# Patient Record
Sex: Male | Born: 1975 | Race: White | Hispanic: No | Marital: Married | Smoking: Never smoker
Health system: Southern US, Community
[De-identification: ages and names within clinical notes are randomized; demographics above are authoritative.]

## PROBLEM LIST (undated history)

## (undated) DIAGNOSIS — F419 Anxiety disorder, unspecified: Secondary | ICD-10-CM

## (undated) DIAGNOSIS — E109 Type 1 diabetes mellitus without complications: Secondary | ICD-10-CM

## (undated) DIAGNOSIS — G8929 Other chronic pain: Secondary | ICD-10-CM

## (undated) DIAGNOSIS — T7840XA Allergy, unspecified, initial encounter: Secondary | ICD-10-CM

## (undated) DIAGNOSIS — M1990 Unspecified osteoarthritis, unspecified site: Secondary | ICD-10-CM

## (undated) HISTORY — PX: ANKLE SURGERY: SHX546

## (undated) HISTORY — DX: Other chronic pain: G89.29

## (undated) HISTORY — DX: Unspecified osteoarthritis, unspecified site: M19.90

## (undated) HISTORY — PX: JOINT REPLACEMENT: SHX530

## (undated) HISTORY — DX: Allergy, unspecified, initial encounter: T78.40XA

## (undated) HISTORY — PX: CARDIAC SURGERY: SHX584

## (undated) HISTORY — PX: OTHER SURGICAL HISTORY: SHX169

## (undated) HISTORY — DX: Anxiety disorder, unspecified: F41.9

## (undated) HISTORY — DX: Type 1 diabetes mellitus without complications: E10.9

---

## 2001-03-25 ENCOUNTER — Emergency Department (HOSPITAL_COMMUNITY): Admission: EM | Admit: 2001-03-25 | Discharge: 2001-03-25 | Payer: Self-pay | Admitting: Internal Medicine

## 2007-04-04 ENCOUNTER — Emergency Department: Payer: Self-pay | Admitting: Emergency Medicine

## 2009-12-23 ENCOUNTER — Emergency Department: Payer: Self-pay | Admitting: Emergency Medicine

## 2010-06-13 ENCOUNTER — Emergency Department: Payer: Self-pay | Admitting: Internal Medicine

## 2010-09-26 ENCOUNTER — Emergency Department (HOSPITAL_COMMUNITY)
Admission: EM | Admit: 2010-09-26 | Discharge: 2010-09-26 | Payer: Self-pay | Source: Home / Self Care | Admitting: Emergency Medicine

## 2011-09-04 ENCOUNTER — Emergency Department: Payer: Self-pay | Admitting: *Deleted

## 2012-01-05 ENCOUNTER — Emergency Department: Payer: Self-pay | Admitting: Emergency Medicine

## 2012-02-13 ENCOUNTER — Observation Stay: Payer: Self-pay | Admitting: Student

## 2012-02-13 DIAGNOSIS — R079 Chest pain, unspecified: Secondary | ICD-10-CM

## 2012-02-13 LAB — CBC
MCH: 29.3 pg (ref 26.0–34.0)
MCHC: 33.1 g/dL (ref 32.0–36.0)
RBC: 5.48 10*6/uL (ref 4.40–5.90)
WBC: 7.9 10*3/uL (ref 3.8–10.6)

## 2012-02-13 LAB — DRUG SCREEN, URINE
Benzodiazepine, Ur Scrn: NEGATIVE (ref ?–200)
Cannabinoid 50 Ng, Ur ~~LOC~~: POSITIVE (ref ?–50)
Cocaine Metabolite,Ur ~~LOC~~: NEGATIVE (ref ?–300)
MDMA (Ecstasy)Ur Screen: NEGATIVE (ref ?–500)
Methadone, Ur Screen: NEGATIVE (ref ?–300)
Opiate, Ur Screen: POSITIVE (ref ?–300)
Phencyclidine (PCP) Ur S: NEGATIVE (ref ?–25)

## 2012-02-13 LAB — CK TOTAL AND CKMB (NOT AT ARMC): CK, Total: 211 U/L (ref 35–232)

## 2012-02-13 LAB — BASIC METABOLIC PANEL
BUN: 13 mg/dL (ref 7–18)
Co2: 29 mmol/L (ref 21–32)
EGFR (African American): >60
Osmolality: 275 (ref 275–301)
Potassium: 4.3 mmol/L (ref 3.5–5.1)

## 2012-02-13 LAB — TROPONIN I
Troponin-I: <0.02 ng/mL
Troponin-I: <0.02 ng/mL

## 2012-02-14 LAB — LIPID PANEL
Cholesterol: 155 mg/dL (ref 0–200)
HDL Cholesterol: 39 mg/dL — ABNORMAL LOW (ref 40–60)
Triglycerides: 120 mg/dL (ref 0–200)

## 2012-02-14 LAB — BASIC METABOLIC PANEL
BUN: 15 mg/dL (ref 7–18)
Chloride: 104 mmol/L (ref 98–107)
Creatinine: 0.94 mg/dL (ref 0.60–1.30)
EGFR (African American): >60
EGFR (Non-African Amer.): >60
Glucose: 89 mg/dL (ref 65–99)

## 2012-02-14 LAB — CBC WITH DIFFERENTIAL/PLATELET
Basophil #: 0 10*3/uL (ref 0.0–0.1)
Eosinophil %: 6.3 %
HCT: 45.6 % (ref 40.0–52.0)
MCH: 29.4 pg (ref 26.0–34.0)
Monocyte #: 0.6 x10 3/mm (ref 0.2–1.0)
Platelet: 150 10*3/uL (ref 150–440)
RBC: 5.17 10*6/uL (ref 4.40–5.90)
RDW: 13.9 % (ref 11.5–14.5)

## 2012-02-14 LAB — CK TOTAL AND CKMB (NOT AT ARMC)
CK, Total: 160 U/L (ref 35–232)
CK, Total: 400 U/L — ABNORMAL HIGH (ref 35–232)
CK-MB: 1.2 ng/mL (ref 0.5–3.6)
CK-MB: 1.5 ng/mL (ref 0.5–3.6)

## 2013-01-10 ENCOUNTER — Emergency Department: Payer: Self-pay | Admitting: Emergency Medicine

## 2013-03-29 ENCOUNTER — Emergency Department: Payer: Self-pay | Admitting: Emergency Medicine

## 2013-03-29 LAB — BASIC METABOLIC PANEL
Anion Gap: 4 — ABNORMAL LOW (ref 7–16)
Calcium, Total: 9.3 mg/dL (ref 8.5–10.1)
Chloride: 105 mmol/L (ref 98–107)
Creatinine: 0.73 mg/dL (ref 0.60–1.30)
EGFR (Non-African Amer.): >60
Glucose: 83 mg/dL (ref 65–99)
Osmolality: 270 (ref 275–301)

## 2013-04-21 ENCOUNTER — Emergency Department: Payer: Self-pay

## 2013-04-21 LAB — BASIC METABOLIC PANEL
Anion Gap: 5 — ABNORMAL LOW (ref 7–16)
Calcium, Total: 8.7 mg/dL (ref 8.5–10.1)
Chloride: 109 mmol/L — ABNORMAL HIGH (ref 98–107)
Creatinine: 0.88 mg/dL (ref 0.60–1.30)
EGFR (African American): >60
EGFR (Non-African Amer.): >60
Glucose: 92 mg/dL (ref 65–99)
Osmolality: 283 (ref 275–301)
Potassium: 4.3 mmol/L (ref 3.5–5.1)
Sodium: 142 mmol/L (ref 136–145)

## 2013-04-21 LAB — CBC WITH DIFFERENTIAL/PLATELET
Basophil %: 0.5 %
HGB: 15.2 g/dL (ref 13.0–18.0)
MCHC: 33.7 g/dL (ref 32.0–36.0)
Monocyte %: 6.4 %
Neutrophil #: 6.3 10*3/uL (ref 1.4–6.5)
RDW: 13.9 % (ref 11.5–14.5)
WBC: 11 10*3/uL — ABNORMAL HIGH (ref 3.8–10.6)

## 2013-05-20 ENCOUNTER — Emergency Department: Payer: Self-pay | Admitting: Internal Medicine

## 2013-05-20 LAB — COMPREHENSIVE METABOLIC PANEL
Albumin: 4 g/dL (ref 3.4–5.0)
BUN: 21 mg/dL — ABNORMAL HIGH (ref 7–18)
Bilirubin,Total: 0.8 mg/dL (ref 0.2–1.0)
Calcium, Total: 9.2 mg/dL (ref 8.5–10.1)
Creatinine: 0.9 mg/dL (ref 0.60–1.30)
EGFR (African American): >60
Glucose: 90 mg/dL (ref 65–99)
Osmolality: 282 (ref 275–301)
Potassium: 4.2 mmol/L (ref 3.5–5.1)
SGOT(AST): 23 U/L (ref 15–37)
Total Protein: 7.1 g/dL (ref 6.4–8.2)

## 2013-05-20 LAB — CBC
HGB: 16.1 g/dL (ref 13.0–18.0)
MCV: 88 fL (ref 80–100)
Platelet: 174 10*3/uL (ref 150–440)
WBC: 9.3 10*3/uL (ref 3.8–10.6)

## 2013-05-20 LAB — PROTIME-INR: INR: 1

## 2013-09-16 ENCOUNTER — Emergency Department: Payer: Self-pay | Admitting: Emergency Medicine

## 2013-09-16 LAB — CBC WITH DIFFERENTIAL/PLATELET
Eosinophil #: 0.7 10*3/uL (ref 0.0–0.7)
MCH: 30.9 pg (ref 26.0–34.0)
MCHC: 34.2 g/dL (ref 32.0–36.0)
Monocyte #: 0.7 x10 3/mm (ref 0.2–1.0)
Neutrophil #: 5.9 10*3/uL (ref 1.4–6.5)
Neutrophil %: 62 %
RBC: 5.63 10*6/uL (ref 4.40–5.90)

## 2013-09-16 LAB — COMPREHENSIVE METABOLIC PANEL
Albumin: 3.9 g/dL (ref 3.4–5.0)
Anion Gap: 6 — ABNORMAL LOW (ref 7–16)
BUN: 16 mg/dL (ref 7–18)
Bilirubin,Total: 0.8 mg/dL (ref 0.2–1.0)
Calcium, Total: 9 mg/dL (ref 8.5–10.1)
Co2: 25 mmol/L (ref 21–32)
EGFR (African American): >60
EGFR (Non-African Amer.): >60
Glucose: 111 mg/dL — ABNORMAL HIGH (ref 65–99)
Osmolality: 278 (ref 275–301)
Potassium: 4.3 mmol/L (ref 3.5–5.1)
SGOT(AST): 32 U/L (ref 15–37)
SGPT (ALT): 41 U/L (ref 12–78)
Sodium: 138 mmol/L (ref 136–145)
Total Protein: 6.9 g/dL (ref 6.4–8.2)

## 2013-09-16 LAB — CK TOTAL AND CKMB (NOT AT ARMC)
CK, Total: 731 U/L — ABNORMAL HIGH (ref 35–232)
CK-MB: 5.6 ng/mL — ABNORMAL HIGH (ref 0.5–3.6)

## 2013-09-16 LAB — URINALYSIS, COMPLETE
Bacteria: NONE SEEN
Bilirubin,UR: NEGATIVE
Blood: NEGATIVE
Glucose,UR: NEGATIVE mg/dL (ref 0–75)
Ketone: NEGATIVE
Leukocyte Esterase: NEGATIVE
Nitrite: NEGATIVE
Ph: 6 (ref 4.5–8.0)
Protein: NEGATIVE
RBC,UR: <1 /HPF (ref 0–5)
Specific Gravity: 1.014 (ref 1.003–1.030)
Squamous Epithelial: <1
WBC UR: 2 /HPF (ref 0–5)

## 2013-09-16 LAB — PROTIME-INR
INR: 0.9
Prothrombin Time: 12.6 secs (ref 11.5–14.7)

## 2013-10-19 DIAGNOSIS — R109 Unspecified abdominal pain: Secondary | ICD-10-CM | POA: Insufficient documentation

## 2013-10-27 ENCOUNTER — Emergency Department: Payer: Self-pay | Admitting: Emergency Medicine

## 2013-11-03 DIAGNOSIS — G8929 Other chronic pain: Secondary | ICD-10-CM | POA: Insufficient documentation

## 2013-11-19 DIAGNOSIS — K279 Peptic ulcer, site unspecified, unspecified as acute or chronic, without hemorrhage or perforation: Secondary | ICD-10-CM | POA: Insufficient documentation

## 2013-11-24 DIAGNOSIS — F4310 Post-traumatic stress disorder, unspecified: Secondary | ICD-10-CM | POA: Insufficient documentation

## 2014-03-26 ENCOUNTER — Emergency Department: Payer: Self-pay | Admitting: Emergency Medicine

## 2014-05-30 IMAGING — CT CT THORACIC SPINE WITHOUT CONTRAST
2 series · 10 of 14 positions shown, 12 images · non-contrast
Comparison: none

REASON FOR EXAM: pain following trauma; abnormal XR
COMMENTS:

PROCEDURE:     CT  - CT THORACIC SPINE WO  - January 10, 2013 [DATE]
RESULT:     Comparison: Radiographs performed same day.
TECHNIQUE: Multiple axial images were obtained of the thoracic spine,
without intravenous contrast. Coronal and sagittal reformats were performed.

[Series 2: axials · axial · 0.36mm/px · z∈[-900,-675]mm · 5 of 113 slices shown, 7 images]
[im 19/113  soft-tissue]
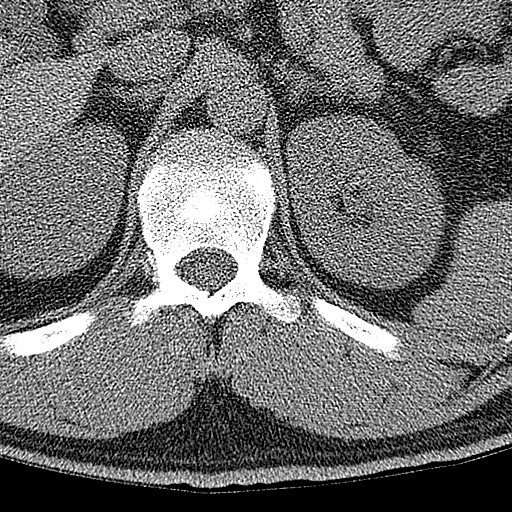
[im 19/113  bone]
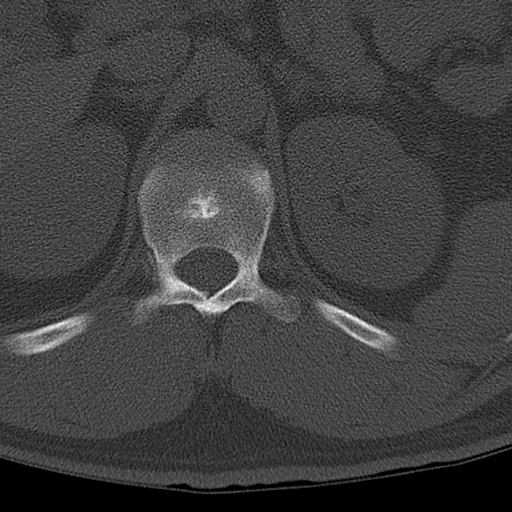
[im 38/113  bone]
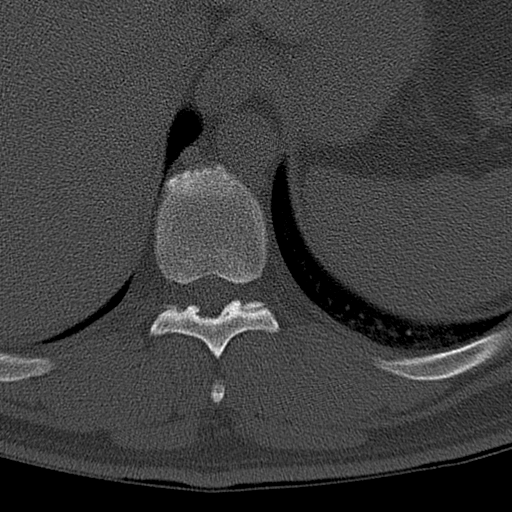
[im 57/113  bone]
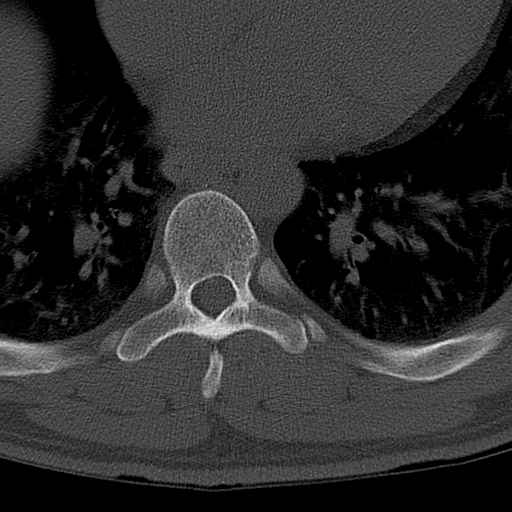
[im 75/113  bone]
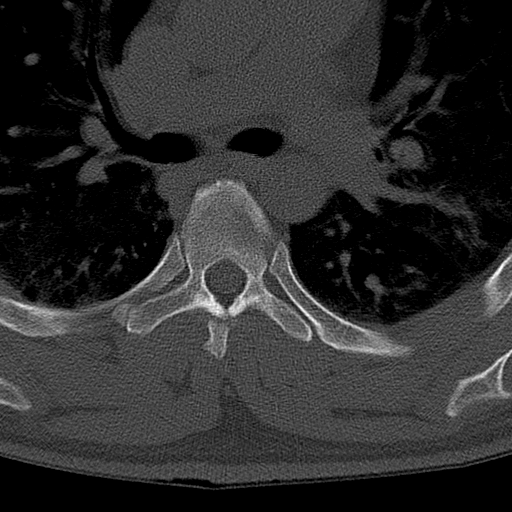
[im 94/113  soft-tissue]
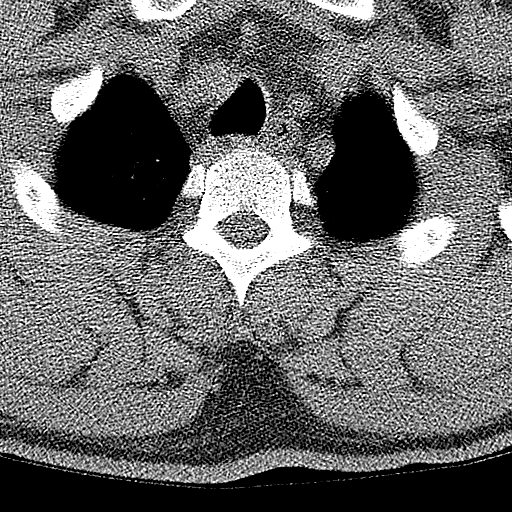
[im 94/113  bone]
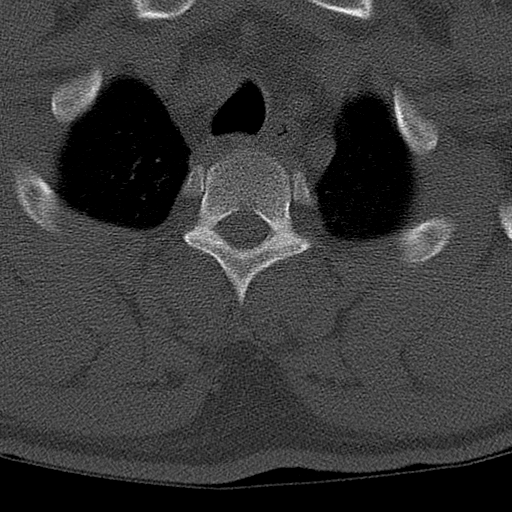

[Series 7: axials soft tissue · axial · 0.36mm/px · z∈[-900,-675]mm · 5 of 113 slices shown]
[im 19/113  soft-tissue]
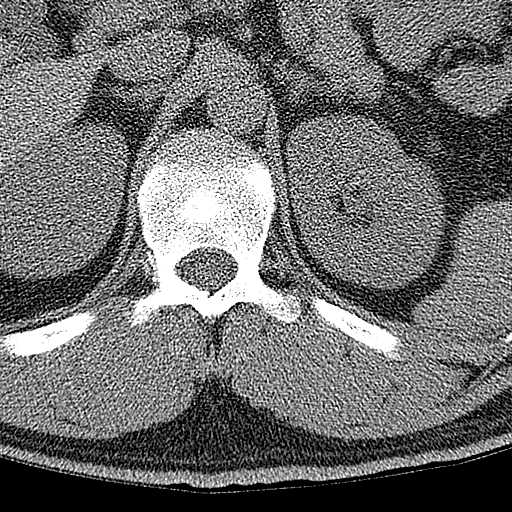
[im 38/113  soft-tissue]
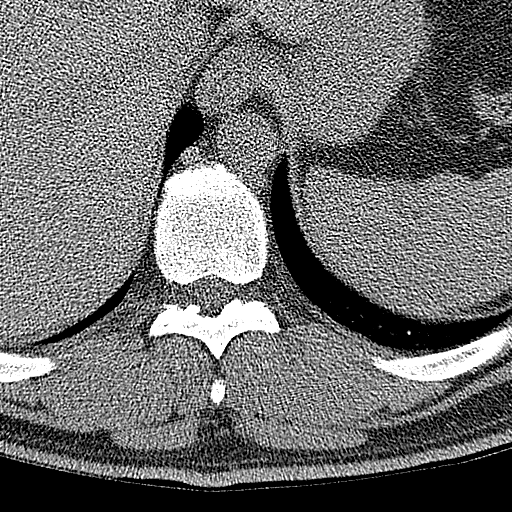
[im 57/113  soft-tissue]
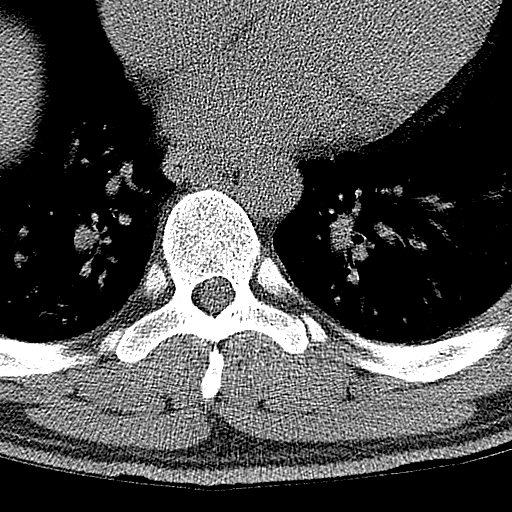
[im 75/113  soft-tissue]
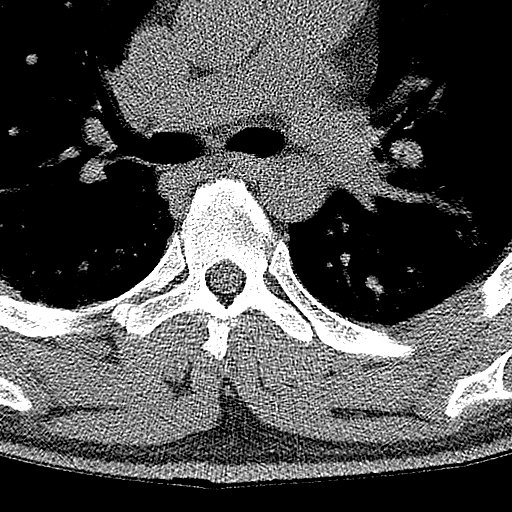
[im 94/113  soft-tissue]
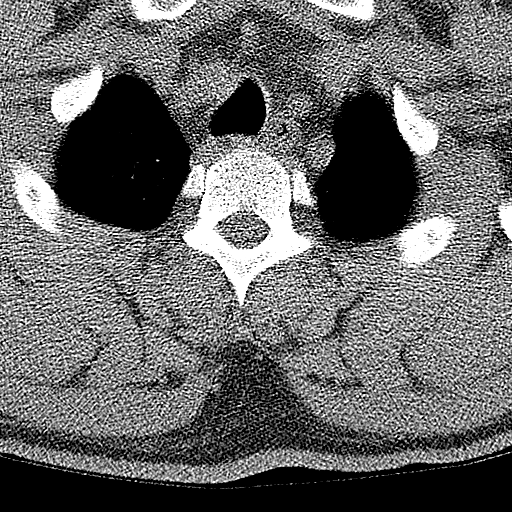

[10 of 14 positions shown; findings below may reference images not displayed]

FINDINGS: There is minimal anterior height loss of the T10, T11, and T12 vertebral
bodies. No definite acute fracture line seen. There are mild endplate
irregularities of these vertebral bodies which are likely due to Schmorl's
nodes. There is normal alignment. There is slight dextrocurvature of the
thoracic spine. Mild dependent pulmonary opacities are likely secondary to
atelectasis.
IMPRESSION: Minimal anterior height loss of the T10, T11, and T12 vertebral bodies is
technically age indeterminate, but likely chronic. Correlate with patient's
history and site of pain. These findings can also be seen as the sequela of
Scheuermann's disease.

[REDACTED]

## 2014-07-05 ENCOUNTER — Ambulatory Visit: Payer: Self-pay

## 2014-08-16 ENCOUNTER — Emergency Department: Payer: Self-pay | Admitting: Student

## 2014-08-16 LAB — URINALYSIS, COMPLETE
BILIRUBIN, UR: NEGATIVE
BLOOD: NEGATIVE
Bacteria: NONE SEEN
Glucose,UR: NEGATIVE mg/dL (ref 0–75)
KETONE: NEGATIVE
Leukocyte Esterase: NEGATIVE
Nitrite: NEGATIVE
PH: 5 (ref 4.5–8.0)
Protein: NEGATIVE
RBC,UR: 1 /HPF (ref 0–5)
Specific Gravity: 1.017 (ref 1.003–1.030)
WBC UR: 2 /HPF (ref 0–5)

## 2014-11-07 LAB — COMPREHENSIVE METABOLIC PANEL
ALT: 50 U/L
ANION GAP: 11 (ref 7–16)
Albumin: 4.3 g/dL (ref 3.4–5.0)
Alkaline Phosphatase: 56 U/L
BILIRUBIN TOTAL: 0.7 mg/dL (ref 0.2–1.0)
BUN: 9 mg/dL (ref 7–18)
CHLORIDE: 106 mmol/L (ref 98–107)
CO2: 25 mmol/L (ref 21–32)
CREATININE: 0.92 mg/dL (ref 0.60–1.30)
Calcium, Total: 9 mg/dL (ref 8.5–10.1)
Glucose: 122 mg/dL — ABNORMAL HIGH (ref 65–99)
OSMOLALITY: 283 (ref 275–301)
POTASSIUM: 3.9 mmol/L (ref 3.5–5.1)
SGOT(AST): 32 U/L (ref 15–37)
SODIUM: 142 mmol/L (ref 136–145)
TOTAL PROTEIN: 7.5 g/dL (ref 6.4–8.2)

## 2014-11-07 LAB — URINALYSIS, COMPLETE
Bacteria: NONE SEEN
Bilirubin,UR: NEGATIVE
Blood: NEGATIVE
Glucose,UR: NEGATIVE mg/dL (ref 0–75)
Ketone: NEGATIVE
Leukocyte Esterase: NEGATIVE
NITRITE: NEGATIVE
PROTEIN: NEGATIVE
Ph: 7 (ref 4.5–8.0)
RBC, UR: NONE SEEN /HPF (ref 0–5)
Specific Gravity: 1.006 (ref 1.003–1.030)
Squamous Epithelial: <1

## 2014-11-07 LAB — CBC WITH DIFFERENTIAL/PLATELET
Basophil #: 0 10*3/uL (ref 0.0–0.1)
Basophil %: 0.4 %
EOS PCT: 7.2 %
Eosinophil #: 0.7 10*3/uL (ref 0.0–0.7)
HCT: 46.1 % (ref 40.0–52.0)
HGB: 15.6 g/dL (ref 13.0–18.0)
LYMPHS ABS: 2.9 10*3/uL (ref 1.0–3.6)
Lymphocyte %: 30.5 %
MCH: 30.7 pg (ref 26.0–34.0)
MCHC: 33.8 g/dL (ref 32.0–36.0)
MCV: 91 fL (ref 80–100)
MONO ABS: 0.5 x10 3/mm (ref 0.2–1.0)
Monocyte %: 5.4 %
Neutrophil #: 5.4 10*3/uL (ref 1.4–6.5)
Neutrophil %: 56.5 %
PLATELETS: 190 10*3/uL (ref 150–440)
RBC: 5.08 10*6/uL (ref 4.40–5.90)
RDW: 13.7 % (ref 11.5–14.5)
WBC: 9.6 10*3/uL (ref 3.8–10.6)

## 2014-11-07 LAB — DRUG SCREEN, URINE
Amphetamines, Ur Screen: NEGATIVE (ref ?–1000)
BARBITURATES, UR SCREEN: NEGATIVE (ref ?–200)
Benzodiazepine, Ur Scrn: NEGATIVE (ref ?–200)
CANNABINOID 50 NG, UR ~~LOC~~: POSITIVE (ref ?–50)
COCAINE METABOLITE, UR ~~LOC~~: NEGATIVE (ref ?–300)
MDMA (Ecstasy)Ur Screen: NEGATIVE (ref ?–500)
Methadone, Ur Screen: NEGATIVE (ref ?–300)
OPIATE, UR SCREEN: POSITIVE (ref ?–300)
Phencyclidine (PCP) Ur S: NEGATIVE (ref ?–25)
TRICYCLIC, UR SCREEN: NEGATIVE (ref ?–1000)

## 2014-11-07 LAB — TROPONIN I

## 2014-11-07 LAB — PROTIME-INR
INR: 1
Prothrombin Time: 12.8 secs (ref 11.5–14.7)

## 2014-11-07 LAB — APTT: ACTIVATED PTT: 27.9 s (ref 23.6–35.9)

## 2014-11-08 ENCOUNTER — Observation Stay: Payer: Self-pay | Admitting: Internal Medicine

## 2014-11-08 LAB — LIPID PANEL
CHOLESTEROL: 146 mg/dL (ref 0–200)
HDL: 35 mg/dL — AB (ref 40–60)
LDL CHOLESTEROL, CALC: 93 mg/dL (ref 0–100)
TRIGLYCERIDES: 92 mg/dL (ref 0–200)
VLDL CHOLESTEROL, CALC: 18 mg/dL (ref 5–40)

## 2014-11-11 LAB — CREATININE, SERUM
Creatinine: 0.92 mg/dL (ref 0.60–1.30)
EGFR (African American): >60
EGFR (Non-African Amer.): >60

## 2014-11-24 ENCOUNTER — Emergency Department: Payer: Self-pay | Admitting: Student

## 2014-11-24 ENCOUNTER — Ambulatory Visit (HOSPITAL_COMMUNITY)
Admission: AD | Admit: 2014-11-24 | Discharge: 2014-11-24 | Disposition: A | Discharge status: Home / Self Care | Payer: Self-pay | Source: Other Acute Inpatient Hospital | Attending: Neurology | Admitting: Neurology

## 2014-11-24 DIAGNOSIS — R569 Unspecified convulsions: Secondary | ICD-10-CM | POA: Insufficient documentation

## 2014-11-24 LAB — COMPREHENSIVE METABOLIC PANEL
ALK PHOS: 55 U/L (ref 46–116)
ANION GAP: 5 — AB (ref 7–16)
Albumin: 4 g/dL (ref 3.4–5.0)
BUN: 13 mg/dL (ref 7–18)
Bilirubin,Total: 0.3 mg/dL (ref 0.2–1.0)
CO2: 28 mmol/L (ref 21–32)
CREATININE: 0.9 mg/dL (ref 0.60–1.30)
Calcium, Total: 9.4 mg/dL (ref 8.5–10.1)
Chloride: 108 mmol/L — ABNORMAL HIGH (ref 98–107)
Glucose: 92 mg/dL (ref 65–99)
OSMOLALITY: 281 (ref 275–301)
POTASSIUM: 4.8 mmol/L (ref 3.5–5.1)
SGOT(AST): 22 U/L (ref 15–37)
SGPT (ALT): 37 U/L (ref 14–63)
SODIUM: 141 mmol/L (ref 136–145)
Total Protein: 7.2 g/dL (ref 6.4–8.2)

## 2014-11-24 LAB — CBC
HCT: 47.4 % (ref 40.0–52.0)
HGB: 15.6 g/dL (ref 13.0–18.0)
MCH: 30 pg (ref 26.0–34.0)
MCHC: 32.9 g/dL (ref 32.0–36.0)
MCV: 91 fL (ref 80–100)
Platelet: 195 10*3/uL (ref 150–440)
RBC: 5.2 10*6/uL (ref 4.40–5.90)
RDW: 14.2 % (ref 11.5–14.5)
WBC: 9.7 10*3/uL (ref 3.8–10.6)

## 2014-11-24 LAB — DRUG SCREEN, URINE
Amphetamines, Ur Screen: NEGATIVE (ref ?–1000)
BARBITURATES, UR SCREEN: NEGATIVE (ref ?–200)
Benzodiazepine, Ur Scrn: POSITIVE (ref ?–200)
CANNABINOID 50 NG, UR ~~LOC~~: POSITIVE (ref ?–50)
COCAINE METABOLITE, UR ~~LOC~~: NEGATIVE (ref ?–300)
MDMA (Ecstasy)Ur Screen: NEGATIVE (ref ?–500)
Methadone, Ur Screen: NEGATIVE (ref ?–300)
OPIATE, UR SCREEN: POSITIVE (ref ?–300)
PHENCYCLIDINE (PCP) UR S: NEGATIVE (ref ?–25)
Tricyclic, Ur Screen: NEGATIVE (ref ?–1000)

## 2014-11-24 LAB — URINALYSIS, COMPLETE
BLOOD: NEGATIVE
Bacteria: NONE SEEN
Bilirubin,UR: NEGATIVE
Glucose,UR: NEGATIVE mg/dL (ref 0–75)
KETONE: NEGATIVE
LEUKOCYTE ESTERASE: NEGATIVE
Nitrite: NEGATIVE
PH: 6 (ref 4.5–8.0)
PROTEIN: NEGATIVE
SPECIFIC GRAVITY: 1.017 (ref 1.003–1.030)

## 2014-11-24 LAB — ACETAMINOPHEN LEVEL: Acetaminophen: <2 ug/mL

## 2014-11-24 LAB — ETHANOL

## 2014-11-24 LAB — TROPONIN I: Troponin-I: <0.02 ng/mL

## 2014-11-24 LAB — CK TOTAL AND CKMB (NOT AT ARMC)
CK, Total: 158 U/L (ref 39–308)
CK-MB: 1.6 ng/mL (ref 0.5–3.6)

## 2014-11-24 LAB — SALICYLATE LEVEL: SALICYLATES, SERUM: 4.2 mg/dL — AB

## 2014-11-25 DIAGNOSIS — IMO0001 Reserved for inherently not codable concepts without codable children: Secondary | ICD-10-CM | POA: Insufficient documentation

## 2014-11-25 DIAGNOSIS — F6812 Factitious disorder with predominantly physical signs and symptoms: Secondary | ICD-10-CM | POA: Insufficient documentation

## 2014-12-26 DIAGNOSIS — F32A Depression, unspecified: Secondary | ICD-10-CM | POA: Insufficient documentation

## 2014-12-26 DIAGNOSIS — F329 Major depressive disorder, single episode, unspecified: Secondary | ICD-10-CM | POA: Insufficient documentation

## 2015-02-19 NOTE — H&P (Signed)
PATIENT NAME:  Carolin CoyLLOYD, Lealon L MR#:  [id number] DATE OF BIRTH:  1976-07-22  DATE OF ADMISSION:  02/13/2012  PRIMARY CARE PHYSICIAN: None.   REFERRING PHYSICIAN: Dr. Mayford KnifeWilliams.   CHIEF COMPLAINT: Chest pain, shortness of breath, and dizziness.   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 He presents with acute onset chest pain about 7:30 this morning.  He describes the pain as a pressure in the middle of his chest with radiation to his neck and also his left arm. He also experienced some left arm numbness.  He experienced some blurry vision and shortness of breath as well. The chest pain is off and on, lasting about 5 to 10 minutes. Of note, the patient experienced an upper respiratory infection, which he thinks was viral, about two weeks ago. Symptoms have resolved. He had a productive cough with whitish sputum, rhinorrhea, and ear fullness. He complains of dizziness on standing up, ambulation, and with head movements. While in the ER he was noted to have sinus arrhythmia with heart rate jumping with range 40s to 80s. His blood pressure is stable. He has no fever or white count and has a negative troponin. Hospitalist services were contacted for further evaluation and management.   PAST MEDICAL HISTORY: Per chart:  1. Hemorrhoids.  2. Probable VSD status post repair at age 89. 3. Multiple surgeries as a result of a fall at work at age 39 with multiple left shoulder and collarbone surgeries as well as abdominal surgeries.   MEDICATIONS: None.   ALLERGIES: Augmentin and Neurontin.   SOCIAL HISTORY: Married. He still works in [employer]. He smokes about 3 to 4 cigarettes a day. He denies alcohol, denies drug use.   FAMILY HISTORY: Denies any history of high blood pressure, diabetes, or heart disease.   REVIEW OF SYSTEMS:   CONSTITUTIONAL: There is fatigue and generalized weakness, intentional weight loss of about 20 pounds in the last 3 to 4 months through diet and exercise. EYES: Blurry vision. No double vision. ENT: No ear pain or hearing loss. Recent sore throat, runny nose, and upper respiratory infection symptoms with fullness in his ears. RESPIRATORY: Denies cough, wheezing, or hemoptysis. There is some dyspnea on exertion. No painful respiration. CARDIOVASCULAR: Chest pain as above. No edema. Endorses some orthopnea. History of VSD repair. GI: Nausea without vomiting, or diarrhea. No abdominal pain or rectal bleeding. GU: Denies dysuria, hematuria, or incontinence. ENDOCRINE: Denies polyuria, nocturia, or thyroid problems. HEME/LYMPH: Denies anemia or easy bruising. SKIN: Denies any rashes. 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 NEUROLOGIC: Denies weakness, dementia, cerebrovascular accident, transient ischemic attack, or seizures.  PSYCHIATRIC: Denies anxiety or insomnia.   PHYSICAL EXAMINATION:  VITAL SIGNS: Temperature on arrival 97, pulse rate 63 currently, respiratory rate 18, blood pressure 125/73, oxygen saturation 96% on room air.   GENERAL: The patient is a well-developed Caucasian male lying in bed in no obvious distress, talking in full sentences.   HEENT: Normocephalic, atraumatic. Pupils are equal and reactive. Extraocular muscles intact. Anicteric sclerae. Moist mucous membranes.  Bilateral ears show erythema and some exudate in the right ear canal. Some bulging of the tympanic membrane on the left. No lymphadenopathy in the cervical region. No tenderness on ear palpation or tugging.    NECK: Supple. No JVD. No thyroid tenderness.   CARDIOVASCULAR: S1, S2. No murmurs  appreciated. No rubs or gallops.   LUNGS: Clear to auscultation without wheezing or rhonchi.   ABDOMEN: Soft, nontender, nondistended. There is an oblique left flank healed surgical scar and there is also left  clavicular area oblique healed surgical scar without drainage, erythema, or tenderness.   EXTREMITIES: No significant lower extremity edema.   NEUROLOGICAL: Cranial nerves II through XII grossly intact. Strength five out of five in all extremities. Sensation intact to light touch.   PSYCH: Awake, alert, oriented times three. Pleasant.  LABORATORY, DIAGNOSTIC, AND RADIOLOGICAL DATA:  Anion gap is 6, otherwise BNP is within normal limits with sodium of 138, potassium 4.3, chloride 103, creatinine 0.84, BUN 13, glucose 79. Troponin negative times one. WBC 7.9, hemoglobin 16, hematocrit 48.4, platelets 176.   EKG: Normal sinus rhythm with sinus arrhythmia, possible premature atrial contraction, rate 57, normal PR, QRS durations. No QTc prolongation.   CT of the head without contrast: No acute abnormality.   CT chest for PE protocol showing pulmonary arteries are normal. Aorta nondistended. Heart size normal.  Adrenal normal. Large airways patent. Lungs are clear.   ASSESSMENT AND PLAN: We have a 39 year old Caucasian male with history of probable VSD repair, tobacco abuse, recent upper respiratory infection which he thought was viral, who presents with acute onset of chest pain, dizziness, shortness of breath, sinus arrhythmia, and possible otitis media. At this point we will admit the patient for observation and to rule out myocardial infarction. The patient has ongoing chest pain although he has no acute ST changes on the EKG and negative troponin. The patient will be placed on the lower level and we will trend the troponins. Check lipids and start the patient on aspirin as well as nitroglycerin and morphine. We will get a stat echocardiogram to evaluate the VSD. He does not appear to be volume overloaded on exam.  We will place the patient on off-telemetry monitor to evaluate this sinus arrhythmia. The patient also will be checked for drug screen. He likely does have possible otitis media and we will  start the patient on Levaquin and meclizine as well. He does have a history of tobacco abuse and we have counseled him for greater than three minutes about cessation. We will start the patient on Lovenox for deep vein thrombosis prophylaxis. Of note, he has been ruled out for PE as well as acute stroke on CT scans. Consider cardiology consult if he has any evidence of structural heart disease or significant arrhythmias.   CODE STATUS: FULL CODE.      TOTAL TIME SPENT: 55 minutes.    ____________________________ Krystal Eaton, MD sa:bjt D: 02/13/2012 15:21:29 ET T: 02/13/2012 15:44:30 ET JOB#: [id number]  cc: Krystal Eaton, MD, <Dictator> Krystal Eaton MD ELECTRONICALLY SIGNED 02/14/2012 12:47

## 2015-02-19 NOTE — Discharge Summary (Signed)
PATIENT NAME:  Jay Sullivan, VILLELLA MR#:  [id number] DATE OF BIRTH:  [birth year]  DATE OF ADMISSION:  02/13/2012 DATE OF DISCHARGE:  02/15/2012  PRIMARY CARE PHYSICIAN: None  REASON FOR ADMISSION: Chest pain, shortness of breath, dizziness.   DISCHARGE DIAGNOSES: 1. Chest pain, atypical, suspect noncardiac cause with exact etiology unclear. Patient is ruled out for myocardial infarction with three negative sets of cardiac enzymes and no pulmonary embolus per chest computed tomography and cardiac enzymes revealing probable normal study with small fixed inferior wall defect likely due to diaphragm attenuation. Normal left ventricular systolic function. Very small area of reversibility in the distal inferolateral wall likely of no clinical significance given its location and very small size with overall low risk scan.  2. Neck pain felt to be from muscle strain versus muscle spasm.  3. Headache and dizziness with possible otitis media.  4. Hypomagnesemia, mild.  5. Intermittent sinus bradycardia felt to be medication induced from narcotics.  6. History of hemorrhoids. 7. History of probable ventricular septal defect status post repair at age 1 with echocardiogram during this hospitalization negative for ventricular septal defect.  8. History of multiple surgeries as result of a fall at work at age 77 multiple left shoulder and collarbone surgeries as well as abdominal surgeries.   CONSULTATIONS: None.   DISCHARGE DISPOSITION: Home.   DISCHARGE MEDICATIONS: 1. Flexeril 5 mg p.o. q.8 hours p.r.n. neck pain or muscle spasm x1 week. 2. Tylenol 325 mg 1 to 2 tablets p.o. q.4-6 hours p.r.n. pain. 3. Levaquin 500 mg p.o. q.24 hours x4 days.  4. Multivitamin 1 tablet p.o. daily.   DISCHARGE CONDITION: Improved, stable.  DISCHARGE ACTIVITY: As tolerated.   DISCHARGE DIET: Regular.   DISCHARGE INSTRUCTIONS: 1. Take medications as prescribed. 2. Return to Emergency Department for recurrence of chest  pain or worsening of neck pain or muscle spasms or development of fever, chills, or for redevelopment of shortness of breath.   FOLLOW UP INSTRUCTIONS:  1. Follow up with primary care physician of your choice within 1 to 2 weeks. Patient needs repeat serum magnesium level within one week.  2. Follow up with your reconstructive surgeon at [facility] within one week.   LABORATORY, DIAGNOSTIC AND RADIOLOGICAL DATA:  Noncontrast head CT 02/13/2012: No acute intracranial abnormalities are noted.   Chest CT with PE protocol 02/13/2012 negative exam.   LexiScan/Myoview 02/14/2012: Probable normal study. Small fixed inferior wall defect which is likely due to diaphragm attenuation. Very small area of reversibility at the distal inferolateral wall likely of no clinical significance given its location and very small size and normal LV systolic function. Overall low risk scan. No significant transient ischemic dilatation visually.   CT of the C-spine without contrast 02/15/2012: No CT evidence of acute osseous abnormalities. Mild dextroscoliosis identified within the cervical spine which may be positional or possibly due to sequelae of muscle spasm. No evidence of prevertebral soft tissue swelling. No evidence of canal stenosis.   2-D echocardiogram 02/13/2012: Essentially normal study. Left ventricular systolic function normal. Ejection fraction greater than 55%. No ventricular septal defect visualized. RV systolic function normal. Left atrium is normal in size. RV systolic pressure is elevated at 30 to 40 mmHg.   Cardiac enzymes negative x3 sets.   Urine drug screen positive for opiates and cannabinoids.   CBC normal on admission.   EKG on admission normal sinus rhythm, heart rate 60 beats per minute with sinus arrhythmia. No acute ST or T wave changes are noted.  Hemoglobin A1c 5.6. Lipid panel total cholesterol 155, triglycerides 120, HDL 39, LDL 92.  Serum magnesium level 1.7 on 02/14/2012.   BRIEF  HISTORY/HOSPITAL COURSE: Patient is a 39 year old male with past medical history of probable VSD at age 89 status post repair, history of hemorrhoids, multiple surgeries as a result of fall at work at age 39 with multiple left shoulder and collarbone surgeries as well as abdominal surgery presented to the Emergency Department with complaints of chest pain, shortness breath as well as dizziness and some neck pain as well as headache. Please see dictated admission history and physical for pertinent details surrounding the onset of this hospitalization and please see below for further details. 1. Chest pain, shortness of breath, dizziness with atypical chest pain felt to be of noncardiac etiology and patient has ruled out for myocardial infarction based off three negative sets of cardiac enzymes and chest CT was negative for PE and echocardiogram was benign. Patient was placed on supportive measures and given pain control. He was hemodynamically stable at time of admission and normal oxygen saturations as well on room air. Cardiac work-up consisted of cardiac enzymes, echocardiogram and cardiac stress test. Cardiac enzymes and echocardiogram were benign as above. Stress test revealed a fixed defect and small area of possible reversibility, however, this was felt to be of no clinical significance per cardiologist read and overall is felt to be a low risk scan. Chest pain felt was felt to be secondary to musculoskeletal etiology versus anxiety. With pain control patient is chest pain free at the time of discharge and his shortness of breath had also resolved. In case of musculoskeletal component he will be discharged home on p.r.n. course of Flexeril as well as p.r.n. Tylenol. No further cardiac work-up for now unless chest pain recurs in the future. He will need to establish primary care for follow up purposes and has been given list of local providers and was told to call and schedule a follow-up appointment to see a  physician of his choice as soon as possible.  2. Neck pain felt to be from muscle spasm. Patient was without any trauma. CT of the C-spine was obtained which was negative for acute osseous abnormalities and was suggestive of muscle spasm but without any soft tissue swelling noted. P.R.N. Flexeril significantly led to improvement of his pain. He was also getting some narcotics which also helped with his pain but this had led to some intermittent bradycardia, therefore narcotics have been discontinued. Meningitis was felt to be less likely given lack of fever and lack of leukocytosis and no nuchal rigidity or photophobia noted. Neck pain is significantly improved with Flexeril.  3. Headache and dizziness, possible otitis media per admitting physician's notes. On my exam otoscopic exam was performed and was unremarkable and tympanic membranes were intact and there is no erythema or drainage noted. Patient was without any ear fullness or earache or any drainage or any difficulty or decreased hearing. With conservative measures his headache and dizziness had resolved. He was placed on empiric antibiotics for possible otitis media which he will complete as an outpatient (has completed three out of seven days of inpatient therapy and has four additional days of Levaquin therapy remaining at the time of discharge). Head CT was negative for acute intracranial abnormalities. He is without any headache or dizziness at the time of discharge. He has remained hemodynamically stable.  4. Hypomagnesemia, very mild, status post magnesium replacement prior to discharge and recommend repeat serum magnesium  level per patient's primary care physician within one week and he was also advised to establish primary care. 5. Otitis media. Patient to continue Levaquin as above. 6. Intermittent sinus bradycardia, asymptomatic, felt to be medication induced from narcotics as he was receiving Dilaudid and Percocet and his heart rate was  normal on admission. At the time of discharge he was currently in normal sinus rhythm and non-bradycardic. He has been weaned off narcotics. Flexeril dose was also decreased. He was without any dizziness, lightheadedness, or presyncopal symptoms at the time of discharge.  7. On 02/15/2012 patient was hemodynamically stable and without any chest pain, shortness of breath, dizziness, or headache and his neck pain has significantly improved and he is felt to be stable for discharge home with close outpatient follow-up to which patient was agreeable.  TIME SPENT ON DISCHARGE: Greater than 30 minutes.   ____________________________ Elon Alas, MD knl:cms D: 02/19/2012 17:58:38 ET T: 02/20/2012 11:18:05 ET JOB#: [id number]  cc: Elon Alas, MD, <Dictator> Elon Alas MD ELECTRONICALLY SIGNED 02/25/2012 18:51

## 2015-02-26 NOTE — H&P (Signed)
PATIENT NAME:  Jay Sullivan, Jay Sullivan MR#:  [id number] DATE OF BIRTH:  Jan 05, 1976  DATE OF ADMISSION:  11/08/2014  PRIMARY CARE PHYSICIAN:  None.   REFERRING PHYSICIAN:  Coolidge BreezeGraydon S. Goodman, MD  CHIEF COMPLAINT:  Right-sided weakness.   HISTORY OF PRESENT ILLNESS:  [name] is a 39 year old male with no past medical history, who comes to the Emergency Department with complaints of left-sided headache and right-sided weakness that started since Sunday associated with slurred speech. The patient states that the patient's girlfriend was admitted to the hospital, stayed for one month, and died last week. He has been under significant stress. Since Sunday, he started to experience severe slurred speech. Concerning this, the patient came to the Emergency Department. Workup in the Emergency Department with CT of the head was unremarkable. Urine drug screen is positive for opiates and cannabinoids. The patient is stuttering. No obvious facial droop.   PAST MEDICAL HISTORY: 1.  Cardiac congenital defect status post repair.  2.  Cholecystectomy.  3.  Left shoulder surgery.   ALLERGIES:  NEURONTIN AND AUGMENTIN.   HOME MEDICATIONS:  None.   SOCIAL HISTORY:  No history of smoking, drinking alcohol, or using illicit drugs. Works as a [employer] architect[employer]ruck driver.   FAMILY HISTORY:  History of hypertension.  REVIEW OF SYSTEMS: CONSTITUTIONAL:  Generalized weakness.  EYES:  No change in vision.  EARS, NOSE, AND THROAT:  No change in hearing. RESPIRATORY:  No cough or shortness of breath.  CARDIOVASCULAR:  No chest pain or palpitations.  GASTROINTESTINAL:  No nausea, vomiting, or abdominal pain.  GENITOURINARY:  No dysuria or hematuria.  HEMATOLOGIC:  No easy bruising or bleeding.  SKIN:  No rash or lesions.  MUSCULOSKELETAL:  No joint pains and aches.  NEUROLOGIC:  No weakness or numbness in any part of the body.   PHYSICAL EXAMINATION: GENERAL:  This is a well-built, well-nourished, age-appropriate male lying down  in the bed, not in distress.  VITAL SIGNS:  Temperature 98.1, pulse 74, blood pressure 111/66, respiratory rate 16, oxygen saturation 99% on room air.  HEENT:  Head is normocephalic, atraumatic. There is no scleral icterus. Conjunctivae are normal. Pupils are equal and react to light. Mucous membranes are moist. No pharyngeal erythema.  NECK:  Supple. No lymphadenopathy. No JVD. No carotid bruit. No thyromegaly.  CHEST:  Has no focal tenderness.  LUNGS:  Bilaterally clear to auscultation.  HEART:  S1, S2 regular. No murmurs are heard.  ABDOMEN:  Bowel sounds present. Soft, nontender, nondistended. No hepatosplenomegaly.  EXTREMITIES:  No pedal edema. Pulses are 2+.  NEUROLOGIC:  The patient is alert and oriented to place, person, and time. Cranial nerves II through XII are intact. Motor is 5/5 in upper and lower extremities. Babinski is downgoing on both sides; however, the patient does not show any effort to raise the right arm, however, no pronator drift.   LABORATORY DATA:  UA is negative for nitrites and leukocyte esterase. Urine drug screen is positive for opiates and cannabinoids. CBC and CMP are completely within normal limits. Cardiac enzymes are negative. Coagulation profile is normal.   ASSESSMENT AND PLAN:  [name] is a 39 year old with no risk factors, who comes to the Emergency Department with right-sided weakness and slurred speech.   1.  Right-sided weakness seems to be highly concerning about conversion disorder. We will obtain MRI of the brain. I discussed with neurology, who recommended this. If MRI is negative, consider consulting psychiatry.  2.  Polysubstance abuse. The patient has opiates  and cannabinoids positive on the urine drug screen. The patient is not on any medications.  3.  Keep the patient on deep vein thrombosis prophylaxis with Lovenox.   TIME SPENT:  50 minutes.    ____________________________ Susa Griffins, MD pv:nb D: 11/08/2014 00:33:52  ET T: 11/08/2014 00:52:03 ET JOB#: [id number]  cc: Susa Griffins, MD, <Dictator> Susa Griffins MD ELECTRONICALLY SIGNED 11/18/2014 21:32

## 2015-02-26 NOTE — Consult Note (Signed)
Brief Consult Note: Diagnosis: somitization/conversion.   Patient was seen by consultant.   Consult note dictated.   Recommend further assessment or treatment.   Orders entered.   Discussed with Attending MD.   Comments: Psychiatry: Patient seen and chart reviewed and case discussed with hospitalist. A large part, perhaps all, of his acute symptoms are likely to be unconcious somitization ie conversion. Important to distinguish from malingering, as I do not have reason to believe this is intentional. Patient absolutely denies any suicidal ideation currently. Will start standing klonipin and qhs restoril for sleep and re-evaluate tomorrow.  Electronic Signatures: Audery Amellapacs, Teva Bronkema T (MD)  (Signed 12-Jan-16 17:09)  Authored: Brief Consult Note   Last Updated: 12-Jan-16 17:09 by Audery Amellapacs, Jaz Laningham T (MD)

## 2015-02-26 NOTE — Discharge Summary (Signed)
PATIENT NAME:  Jay Sullivan, Jay Sullivan MR#:  [id number] DATE OF BIRTH:  10/07/1976  For a detailed note, please see the history and physical done on admission by Dr. Heron NayVasireddy.   DIAGNOSES AT DISCHARGE:  1. 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 Acute stroke in acute seizures ruled out.  2. History of depression, polysubstance abuse.   DIET: The patient is being discharged on a regular diet.   ACTIVITY: As tolerated.   FOLLOWUP: With his mental health provider in the next 1 to 2 weeks. Also, followup at the [facility] in the next 1 to 2 days.    DISCHARGE MEDICATIONS: None.  CONSULTANTS DURING THE HOSPITAL COURSE: Dr. Mellody DrownMatthew Smith from neurology, Dr. Annett Gulaerry Clapacs from psychiatry, Dr. Mayford KnifeWilliams from psychiatry.   PERTINENT STUDIES DONE DURING THE HOSPITAL COURSE: A CT scan of the head done without contrast showing no acute intracranial abnormality. An MRI of the brain done without contrast showing no intracranial hemorrhage, no intracranial mass lesion, and no acute intracranial abnormality. EEG done on 11/08/2014 showing normal, awake EEG with no evidence of epilepsy.   HOSPITAL COURSE: This is a 39 year old male with medical problems as mentioned above, presented to the hospital on 11/08/2014 due to right-sided weakness, difficulty talking, and suspected seizures.   PROBLEM #1: Right-sided weakness. Initially, this was thought to be suspected stroke, therefore the patient underwent an extensive work-up including a CT head and MRI of the brain, which were negative. The patient was seen by neurology. They did not suspect had any evidence of an acute stroke. They suspected maybe this could be seizures, although the patient's EEG showed no evidence of epilepsy. Most likely cause of the patient's weakness was psychogenic related to a conversion disorder, therefore, psychiatry was consulted. The patient continued to have some weakness and convulsion  episodes, which were intermittently treated with some Klonopin some Restoril. Although upon discharge, the patient was able to ambulate without any residual neurologic deficits. He was recommended to see a mental health provider, although the patient declined that. He was also offered possible behavioral therapy in inpatient behavioral medicine, although he refused that too.   PROBLEM #2: Convulsions. These were also likely psychogenic related to conversion disorder. The patient was seen by neurology, had an EEG done, which was negative. He was not given any antiepileptics. The patient was being maintained on some as-needed Klonopin, although he was not discharged on that, as he did not want to seek any further psychiatric help.    PROBLEM #3: Depression. The patient had no suicidal ideations, therefore did not qualify for any inpatient psychiatric services. He was recommended for some behavioral rehab for his fictitious/conversion disorder, which he adamantly declined.   PROBLEM #4: Headache. The patient was maintained on some as-needed Fioricet and tramadol, although he was not discharged on those medications as his symptoms has resolved.   The patient is a full code. He was discharged home in stable condition.   TIME SPENT: 35 minutes  ____________________________ Rolly PancakeVivek J. Cherlynn KaiserSainani, MD vjs:ap D: 11/13/2014 13:09:23 ET T: 11/13/2014 21:37:54 ET JOB#: [id number]  cc: Rolly PancakeVivek J. Cherlynn KaiserSainani, MD, <Dictator> [facility] [place] SirenVIVEK J Madlynn Lundeen MD ELECTRONICALLY SIGNED 11/22/2014 11:51
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
## 2015-02-26 NOTE — Consult Note (Signed)
Psychiatry: Patient seen both this morning and this evening.  He continues with his symptoms without much change.  I have authorized frequent benzodiazepine use which so far seems to have had little impact.  Also allow some treatment with Vicodin for his headache.  This evening he is stating that he was told today that his girlfriend has died.  I do not have any outside confirmation of this.  Remains tearful and upset.  Continues to deny suicidal ideation.  Patient continues to not be able to ambulate independently in a safe manner.  In addition it is not clear whether he would benefit from psychiatric treatment.  He is begging not to go to psychiatry.  Supportive therapy done.  No change to current medicine.  Electronic Signatures: Audery Amellapacs, Jafeth Mustin T (MD)  (Signed on 15-Jan-16 18:41)  Authored  Last Updated: 15-Jan-16 18:41 by Audery Amellapacs, Travon Crochet T (MD)
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
## 2015-02-26 NOTE — Consult Note (Signed)
Psychiatry: PAtient seen and conferred with nursing staff. Over the past hour patient has exhibited several of his spells of shaking and decreased responsiveness. Even when not in a spell he is more tremulous and less coherant than yesterday. Evidently he learned today that his girlfriend was discharged to hospice care today. Patient unable currently to give any history other than that his head is hurting a lot over the left orbit and back over the left side. worsening of anxiety and attendant somatic symptoms. Will order stat ativan 2mg  iv and then prn repeat q1 hr for agitation. Also stat 30mg  iv toradol for headache. Staff feel more comfortable with patient's safety if a sitter is ordered due to his acute worsening and potentially unpredictable behavior and I fully agree. Sitter ordered for now.  Electronic Signatures: Audery Amellapacs, Shameka Aggarwal T (MD)  (Signed on 14-Jan-16 18:09)  Authored  Last Updated: 14-Jan-16 18:09 by Audery Amellapacs, Latressa Harries T (MD)

## 2015-02-26 NOTE — Consult Note (Signed)
PATIENT NAME:  Jay Sullivan, Future L MR#:  [id number] DATE OF BIRTH:  1976/10/04  DATE OF CONSULTATION:  11/12/2014  REFERRING PHYSICIAN:   CONSULTING PHYSICIAN:  Burdette Gergely L. Mayford KnifeWilliams, MD  REASON FOR CONSULTATION:  This patient has been previously seen by Mordecai RasmussenJohn Clapacs, MD over the past few days.  I was contacted by nursing because the patient was upset with the current stages of his assessment for his complaints.  He has complained of weakness on his right side and seizure activity.  Psychiatry has been following him and addressing his complaints of anxiety.  He has been treated with benzodiazepines related to his anxiety.  In addition, the issue of disposition for psychiatric care has come up.  The patient has consistently declined an inpatient psychiatric admission.  The discussion was also made of going to psychiatric rehabilitation.  The patient today states he does not really want that, as he feels his symptoms are physical and they have a physical basis for them.    This Clinical research associatewriter did speak with the patient's attending physician, Dr. Hilda LiasVivek Sainani.  He indicated that there is not a physical basis for the complaints.  The previous psychiatric consultation notes have opined that this may be a conversion disorder.  Some of the basis is that the patient has recently lost his fiance earlier this week.    The patient today states that he wants to leave to get a second opinion for what he believes is a physical problems.  He adamantly denies any suicidal ideation.  I have consulted with family members, including his father, and they indicate that have no worries or concerns about self-injurious behavior.  The patient denies any past suicide attempts.  The family does not provide any history of past suicide attempts or self-injurious behavior.  I explained to the patient that I could offer an inpatient psychiatric admission.  I also explained that psychiatric rehabilitation was available, however it would not occur this  weekend.  The patient indicates that he does not feel that is what needs to been addressed and that he has real physical issues that are causing his problems.  He does not meet criteria for commitment for inpatient psychiatric hospitalization.    RISK FACTORS:  He denies any past suicide attempts and the family is not supporting any history or basis to believe there is suicidal ideation.  The patient denies any history consistent with substance use disorder.  He does have a daughter who lives close to him.  He has family that are supportive here in the waiting area.  He demonstrates forward thinking as he is insistent on getting his medical complaints evaluated.  At this time, there is low risk of imminent harm to himself.  He appears to have the support of his family to seek second opinions about his care.  MENTAL STATUS EXAMINATION:  The patient was a young male, appearing his stated age of 39.  He is lying in bed.  He made good eye contact.  His speech was normal volume, but slightly dysarthric.  This odd slurring of speech was noted upon his initial evaluation back on 11/08/2014.  His thought process was linear and goal-directed.  His thought content, he denied any suicidal ideation, denied any homicidal ideation.  There is no evidence of auditory hallucinations or visual hallucinations.  His mood he stated was bad.  His affect was anxious.  His insight and judgment appear to be fair.  He appears to be of average intelligence.  ASSESSMENT:  Adjustment disorder with anxiety. Per review of the record and discussion with his treating attending physician, it appears there is no medical basis for his problems.  This would support diagnoses of possible diagnosis of conversion disorder or another somatoform type of illness.  As discussed above, there is no evidence of at this time to pursue an involuntary commitment to the hospital.  I have communicated this to nursing.  Nursing indicated they would relay my  findings and opinion to the patient's attending physician.         ____________________________ Loralie Champagne. Mayford Knife, MD alw:DT D: 11/12/2014 16:36:14 ET T: 11/12/2014 17:03:23 ET JOB#: [id number]  cc: Leory Plowman L. Mayford Knife, MD, <Dictator> Kerin Salen MD ELECTRONICALLY SIGNED 11/14/2014 17:25

## 2015-02-26 NOTE — Consult Note (Signed)
Psychiatry: Follow-up for this patient having odd pseudo-neurological symptoms.  On my examination today the patient presents similarly to yesterday.  He continues to speak with an odd dysphonia.  Continues to report that he is having spells that happen frequently.  He doesn't feel like he is any better than he was yesterday although he did sleep well last night.  Not reporting any suicidality in fact he absolutely denies it.  Reports that his mood is worried.  Doesn't report any psychotic symptoms. review of systems everything is the same as it was yesterday except that he slept better last night. he continues to have a slightly constricted affect mood stated as worried.  Totally denies suicidal ideation.  No hallucinations no evidence of psychosis.  Alert and oriented 4.  Patient says the clonazepam does not make him feel particularly relaxed. continues with what to me appear to be conversion or possibly factitious symptoms. will increase his clonazepam to 1 mg 3 times a day see if that works any better at decreasing his anxiety and perhaps improving his symptoms.  Case discussed with the hospitalist.  Patient does not meet criteria for inpatient psychiatric hospitalization.  Although he admits that he has been feeling nervous having his insight about his condition and psychiatrically is borderline at best.  We will continue to follow. conversion disorder  Electronic Signatures: Javarion Douty, Jackquline DenmarkJohn T (MD)  (Signed on 13-Jan-16 18:12)  Authored  Last Updated: 13-Jan-16 18:12 by Audery Amellapacs, Gwyn Hieronymus T (MD)

## 2016-07-29 DIAGNOSIS — M75101 Unspecified rotator cuff tear or rupture of right shoulder, not specified as traumatic: Secondary | ICD-10-CM | POA: Insufficient documentation

## 2016-07-29 DIAGNOSIS — G5603 Carpal tunnel syndrome, bilateral upper limbs: Secondary | ICD-10-CM | POA: Insufficient documentation

## 2017-07-02 ENCOUNTER — Emergency Department (HOSPITAL_COMMUNITY): Payer: Self-pay

## 2017-07-02 ENCOUNTER — Encounter (HOSPITAL_COMMUNITY): Payer: Self-pay | Admitting: *Deleted

## 2017-07-02 ENCOUNTER — Emergency Department (HOSPITAL_COMMUNITY)
Admission: EM | Admit: 2017-07-02 | Discharge: 2017-07-02 | Disposition: A | Discharge status: Home / Self Care | Payer: Self-pay | Attending: Emergency Medicine | Admitting: Emergency Medicine

## 2017-07-02 DIAGNOSIS — S161XXA Strain of muscle, fascia and tendon at neck level, initial encounter: Secondary | ICD-10-CM | POA: Insufficient documentation

## 2017-07-02 DIAGNOSIS — R1084 Generalized abdominal pain: Secondary | ICD-10-CM | POA: Insufficient documentation

## 2017-07-02 DIAGNOSIS — R51 Headache: Secondary | ICD-10-CM | POA: Insufficient documentation

## 2017-07-02 DIAGNOSIS — S8992XA Unspecified injury of left lower leg, initial encounter: Secondary | ICD-10-CM | POA: Insufficient documentation

## 2017-07-02 DIAGNOSIS — M25562 Pain in left knee: Secondary | ICD-10-CM

## 2017-07-02 DIAGNOSIS — Y999 Unspecified external cause status: Secondary | ICD-10-CM | POA: Insufficient documentation

## 2017-07-02 DIAGNOSIS — F17220 Nicotine dependence, chewing tobacco, uncomplicated: Secondary | ICD-10-CM | POA: Insufficient documentation

## 2017-07-02 DIAGNOSIS — Y92410 Unspecified street and highway as the place of occurrence of the external cause: Secondary | ICD-10-CM | POA: Insufficient documentation

## 2017-07-02 DIAGNOSIS — Y939 Activity, unspecified: Secondary | ICD-10-CM | POA: Insufficient documentation

## 2017-07-02 DIAGNOSIS — M79602 Pain in left arm: Secondary | ICD-10-CM | POA: Insufficient documentation

## 2017-07-02 LAB — URINALYSIS, ROUTINE W REFLEX MICROSCOPIC
BACTERIA UA: NONE SEEN
Bilirubin Urine: NEGATIVE
GLUCOSE, UA: NEGATIVE mg/dL
Hgb urine dipstick: NEGATIVE
Ketones, ur: NEGATIVE mg/dL
Leukocytes, UA: NEGATIVE
Nitrite: NEGATIVE
Protein, ur: 100 mg/dL — AB
SPECIFIC GRAVITY, URINE: 1.045 — AB (ref 1.005–1.030)
pH: 5 (ref 5.0–8.0)

## 2017-07-02 LAB — CBC WITH DIFFERENTIAL/PLATELET
BASOS ABS: 0 10*3/uL (ref 0.0–0.1)
BASOS PCT: 0 %
Eosinophils Absolute: 0.3 10*3/uL (ref 0.0–0.7)
Eosinophils Relative: 3 %
HEMATOCRIT: 45.9 % (ref 39.0–52.0)
Hemoglobin: 15.3 g/dL (ref 13.0–17.0)
LYMPHS ABS: 2.3 10*3/uL (ref 0.7–4.0)
LYMPHS PCT: 26 %
MCH: 28.9 pg (ref 26.0–34.0)
MCHC: 33.3 g/dL (ref 30.0–36.0)
MCV: 86.8 fL (ref 78.0–100.0)
Monocytes Absolute: 0.6 10*3/uL (ref 0.1–1.0)
Monocytes Relative: 6 %
Neutro Abs: 5.8 10*3/uL (ref 1.7–7.7)
Neutrophils Relative %: 65 %
PLATELETS: 210 10*3/uL (ref 150–400)
RBC: 5.29 MIL/uL (ref 4.22–5.81)
RDW: 13.7 % (ref 11.5–15.5)
WBC: 9 10*3/uL (ref 4.0–10.5)

## 2017-07-02 LAB — BASIC METABOLIC PANEL
Anion gap: 13 (ref 5–15)
BUN: 11 mg/dL (ref 6–20)
CALCIUM: 9.5 mg/dL (ref 8.9–10.3)
CO2: 24 mmol/L (ref 22–32)
CREATININE: 1.04 mg/dL (ref 0.61–1.24)
Chloride: 103 mmol/L (ref 101–111)
GLUCOSE: 100 mg/dL — AB (ref 65–99)
Potassium: 3.7 mmol/L (ref 3.5–5.1)
Sodium: 140 mmol/L (ref 135–145)

## 2017-07-02 MED ORDER — IBUPROFEN 800 MG PO TABS: 800.0000 mg | ORAL_TABLET | Freq: Three times a day (TID) | ORAL | 0 refills | Status: DC | PRN

## 2017-07-02 MED ORDER — MORPHINE SULFATE (PF) 4 MG/ML IV SOLN
4.0000 mg | INTRAVENOUS | Status: AC | PRN
  Administered 2017-07-02 (×3): 4 mg via INTRAVENOUS
  Filled 2017-07-02 (×3): qty 1

## 2017-07-02 MED ORDER — IBUPROFEN 800 MG PO TABS
800.0000 mg | ORAL_TABLET | Freq: Once | ORAL | Status: AC
  Administered 2017-07-02: 800 mg via ORAL
  Filled 2017-07-02: qty 1

## 2017-07-02 MED ORDER — ONDANSETRON HCL 4 MG/2ML IJ SOLN
4.0000 mg | Freq: Once | INTRAMUSCULAR | Status: AC
  Administered 2017-07-02: 4 mg via INTRAVENOUS
  Filled 2017-07-02: qty 2

## 2017-07-02 MED ORDER — OXYCODONE-ACETAMINOPHEN 5-325 MG PO TABS
1.0000 | ORAL_TABLET | Freq: Once | ORAL | Status: AC
  Administered 2017-07-02: 1 via ORAL
  Filled 2017-07-02: qty 1

## 2017-07-02 MED ORDER — METHOCARBAMOL 500 MG PO TABS: 500.0000 mg | ORAL_TABLET | Freq: Two times a day (BID) | ORAL | 0 refills | Status: DC

## 2017-07-02 MED ORDER — IOPAMIDOL (ISOVUE-300) INJECTION 61%
INTRAVENOUS | Status: AC
  Administered 2017-07-02: 100 mL
  Filled 2017-07-02: qty 100

## 2017-07-02 MED ORDER — OXYCODONE-ACETAMINOPHEN 5-325 MG PO TABS: 1.0000 | ORAL_TABLET | ORAL | 0 refills | Status: DC | PRN
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
## 2017-07-02 NOTE — ED Notes (Signed)
Ambulated pt in hallway with crutches, no assist.  He was able to walk 5-6 steps outside of room, then had to return to room, due to pain scale of 9.  Pt. Was able to balance and support his own weight with use of crutches, while not putting weight on left leg.

## 2017-07-02 NOTE — Progress Notes (Signed)
Orthopedic Tech Progress Note Patient Details:  Jay Sullivan [birth year] [id number]  Ortho Devices Type of Ortho Device: Crutches Ortho Device/Splint Interventions: Ordered, Adjustment   Jennye MoccasinHughes, Ambrose Wile Craig 07/02/2017, 9:31 PM

## 2017-07-02 NOTE — ED Notes (Signed)
Pt verbalized understanding discharge instructions and denies any further needs or questions at this time. VS stable, ambulatory and steady gait.   
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
## 2017-07-02 NOTE — Discharge Instructions (Signed)

## 2017-07-02 NOTE — ED Notes (Signed)
Pt able to take 5-6 steps out the door and had to return to room due to pain.  Pt unable to put much weight on left side.
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
## 2017-08-04 ENCOUNTER — Ambulatory Visit: Payer: Self-pay | Admitting: Family Medicine

## 2019-04-01 ENCOUNTER — Emergency Department: Payer: BC Managed Care – PPO

## 2019-04-01 ENCOUNTER — Emergency Department
Admission: EM | Admit: 2019-04-01 | Discharge: 2019-04-01 | Disposition: A | Discharge status: Home / Self Care | Payer: BC Managed Care – PPO | Attending: Emergency Medicine | Admitting: Emergency Medicine

## 2019-04-01 ENCOUNTER — Other Ambulatory Visit: Payer: Self-pay

## 2019-04-01 DIAGNOSIS — M25561 Pain in right knee: Secondary | ICD-10-CM | POA: Diagnosis not present

## 2019-04-01 DIAGNOSIS — F17220 Nicotine dependence, chewing tobacco, uncomplicated: Secondary | ICD-10-CM | POA: Insufficient documentation

## 2019-04-01 DIAGNOSIS — S0990XA Unspecified injury of head, initial encounter: Secondary | ICD-10-CM | POA: Diagnosis not present

## 2019-04-01 DIAGNOSIS — Y92410 Unspecified street and highway as the place of occurrence of the external cause: Secondary | ICD-10-CM | POA: Diagnosis not present

## 2019-04-01 DIAGNOSIS — M549 Dorsalgia, unspecified: Secondary | ICD-10-CM | POA: Diagnosis not present

## 2019-04-01 DIAGNOSIS — Y939 Activity, unspecified: Secondary | ICD-10-CM | POA: Diagnosis not present

## 2019-04-01 DIAGNOSIS — M542 Cervicalgia: Secondary | ICD-10-CM | POA: Diagnosis not present

## 2019-04-01 DIAGNOSIS — M545 Low back pain: Secondary | ICD-10-CM | POA: Diagnosis not present

## 2019-04-01 DIAGNOSIS — Y999 Unspecified external cause status: Secondary | ICD-10-CM | POA: Diagnosis not present

## 2019-04-01 LAB — CBC WITH DIFFERENTIAL/PLATELET
Abs Immature Granulocytes: 0.03 10*3/uL (ref 0.00–0.07)
Basophils Absolute: 0.1 10*3/uL (ref 0.0–0.1)
Basophils Relative: 1 %
Eosinophils Absolute: 0.3 10*3/uL (ref 0.0–0.5)
Eosinophils Relative: 3 %
HCT: 45.3 % (ref 39.0–52.0)
Hemoglobin: 15.3 g/dL (ref 13.0–17.0)
Immature Granulocytes: 0 %
Lymphocytes Relative: 26 %
Lymphs Abs: 2.9 10*3/uL (ref 0.7–4.0)
MCH: 29.7 pg (ref 26.0–34.0)
MCHC: 33.8 g/dL (ref 30.0–36.0)
MCV: 88 fL (ref 80.0–100.0)
Monocytes Absolute: 0.6 10*3/uL (ref 0.1–1.0)
Monocytes Relative: 6 %
Neutro Abs: 7 10*3/uL (ref 1.7–7.7)
Neutrophils Relative %: 64 %
Platelets: 212 10*3/uL (ref 150–400)
RBC: 5.15 MIL/uL (ref 4.22–5.81)
RDW: 13.1 % (ref 11.5–15.5)
WBC: 10.9 10*3/uL — ABNORMAL HIGH (ref 4.0–10.5)
nRBC: 0 % (ref 0.0–0.2)

## 2019-04-01 LAB — COMPREHENSIVE METABOLIC PANEL
ALT: 41 U/L (ref 0–44)
AST: 31 U/L (ref 15–41)
Albumin: 4.4 g/dL (ref 3.5–5.0)
Alkaline Phosphatase: 68 U/L (ref 38–126)
Anion gap: 10 (ref 5–15)
BUN: 14 mg/dL (ref 6–20)
CO2: 25 mmol/L (ref 22–32)
Calcium: 9.2 mg/dL (ref 8.9–10.3)
Chloride: 103 mmol/L (ref 98–111)
Creatinine, Ser: 0.96 mg/dL (ref 0.61–1.24)
GFR calc Af Amer: >60 mL/min (ref >60)
GFR calc non Af Amer: >60 mL/min (ref >60)
Glucose, Bld: 222 mg/dL — ABNORMAL HIGH (ref 70–99)
Potassium: 4.3 mmol/L (ref 3.5–5.1)
Sodium: 138 mmol/L (ref 135–145)
Total Bilirubin: 0.8 mg/dL (ref 0.3–1.2)
Total Protein: 7.3 g/dL (ref 6.5–8.1)

## 2019-04-01 LAB — TROPONIN I: Troponin I: <0.03 ng/mL (ref <0.03)

## 2019-04-01 MED ORDER — ONDANSETRON HCL 4 MG/2ML IJ SOLN
INTRAMUSCULAR | Status: AC
  Administered 2019-04-01: 4 mg
  Filled 2019-04-01: qty 2

## 2019-04-01 MED ORDER — ONDANSETRON HCL 4 MG PO TABS: 4.0000 mg | ORAL_TABLET | Freq: Three times a day (TID) | ORAL | 0 refills | Status: AC | PRN

## 2019-04-01 MED ORDER — OXYCODONE-ACETAMINOPHEN 5-325 MG PO TABS
1.0000 | ORAL_TABLET | Freq: Once | ORAL | Status: AC
  Administered 2019-04-01: 1 via ORAL
  Filled 2019-04-01: qty 1

## 2019-04-01 MED ORDER — IOHEXOL 300 MG/ML  SOLN
100.0000 mL | Freq: Once | INTRAMUSCULAR | Status: AC | PRN
  Administered 2019-04-01: 100 mL via INTRAVENOUS
  Filled 2019-04-01: qty 100

## 2019-04-01 MED ORDER — MORPHINE SULFATE (PF) 4 MG/ML IV SOLN
4.0000 mg | Freq: Once | INTRAVENOUS | Status: AC
  Administered 2019-04-01: 21:00:00 4 mg via INTRAVENOUS
  Filled 2019-04-01: qty 1

## 2019-04-01 MED ORDER — FENTANYL CITRATE (PF) 100 MCG/2ML IJ SOLN
100.0000 ug | Freq: Once | INTRAMUSCULAR | Status: AC
  Administered 2019-04-01: 100 ug via INTRAVENOUS
  Filled 2019-04-01: qty 2

## 2019-04-01 MED ORDER — OXYCODONE-ACETAMINOPHEN 5-325 MG PO TABS: 1.0000 | ORAL_TABLET | Freq: Four times a day (QID) | ORAL | 0 refills | Status: AC | PRN
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
## 2019-04-01 NOTE — ED Notes (Signed)
Patient AA0x4. Vitals stable. NAD. 

## 2019-04-08 ENCOUNTER — Other Ambulatory Visit: Payer: Self-pay | Admitting: Orthopedic Surgery

## 2019-04-08 DIAGNOSIS — S8991XA Unspecified injury of right lower leg, initial encounter: Secondary | ICD-10-CM

## 2019-05-06 ENCOUNTER — Emergency Department
Admission: EM | Admit: 2019-05-06 | Discharge: 2019-05-06 | Disposition: A | Discharge status: Home / Self Care | Payer: BC Managed Care – PPO | Attending: Emergency Medicine | Admitting: Emergency Medicine

## 2019-05-06 ENCOUNTER — Encounter: Payer: Self-pay | Admitting: Emergency Medicine

## 2019-05-06 ENCOUNTER — Ambulatory Visit: Admission: RE | Admit: 2019-05-06 | Payer: BC Managed Care – PPO | Source: Ambulatory Visit

## 2019-05-06 ENCOUNTER — Emergency Department: Payer: BC Managed Care – PPO

## 2019-05-06 ENCOUNTER — Other Ambulatory Visit: Payer: Self-pay

## 2019-05-06 DIAGNOSIS — Z20828 Contact with and (suspected) exposure to other viral communicable diseases: Secondary | ICD-10-CM | POA: Insufficient documentation

## 2019-05-06 DIAGNOSIS — R0602 Shortness of breath: Secondary | ICD-10-CM

## 2019-05-06 DIAGNOSIS — R509 Fever, unspecified: Secondary | ICD-10-CM | POA: Diagnosis present

## 2019-05-06 DIAGNOSIS — Z7189 Other specified counseling: Secondary | ICD-10-CM

## 2019-05-06 DIAGNOSIS — B349 Viral infection, unspecified: Secondary | ICD-10-CM

## 2019-05-06 DIAGNOSIS — R519 Headache, unspecified: Secondary | ICD-10-CM

## 2019-05-06 DIAGNOSIS — F17220 Nicotine dependence, chewing tobacco, uncomplicated: Secondary | ICD-10-CM | POA: Diagnosis not present

## 2019-05-06 DIAGNOSIS — R05 Cough: Secondary | ICD-10-CM | POA: Insufficient documentation

## 2019-05-06 LAB — CBC WITH DIFFERENTIAL/PLATELET
Abs Immature Granulocytes: 0.02 10*3/uL (ref 0.00–0.07)
Basophils Absolute: 0.1 10*3/uL (ref 0.0–0.1)
Basophils Relative: 1 %
Eosinophils Absolute: 0.1 10*3/uL (ref 0.0–0.5)
Eosinophils Relative: 2 %
HCT: 45.9 % (ref 39.0–52.0)
Hemoglobin: 15.9 g/dL (ref 13.0–17.0)
Immature Granulocytes: 0 %
Lymphocytes Relative: 51 %
Lymphs Abs: 3.6 10*3/uL (ref 0.7–4.0)
MCH: 29.8 pg (ref 26.0–34.0)
MCHC: 34.6 g/dL (ref 30.0–36.0)
MCV: 86.1 fL (ref 80.0–100.0)
Monocytes Absolute: 0.5 10*3/uL (ref 0.1–1.0)
Monocytes Relative: 8 %
Neutro Abs: 2.7 10*3/uL (ref 1.7–7.7)
Neutrophils Relative %: 38 %
Platelets: 155 10*3/uL (ref 150–400)
RBC: 5.33 MIL/uL (ref 4.22–5.81)
RDW: 13.2 % (ref 11.5–15.5)
Smear Review: NORMAL
WBC: 7.1 10*3/uL (ref 4.0–10.5)
nRBC: 0 % (ref 0.0–0.2)

## 2019-05-06 LAB — BASIC METABOLIC PANEL
Anion gap: 12 (ref 5–15)
BUN: 10 mg/dL (ref 6–20)
CO2: 23 mmol/L (ref 22–32)
Calcium: 9.5 mg/dL (ref 8.9–10.3)
Chloride: 101 mmol/L (ref 98–111)
Creatinine, Ser: 0.8 mg/dL (ref 0.61–1.24)
GFR calc Af Amer: >60 mL/min (ref >60)
GFR calc non Af Amer: >60 mL/min (ref >60)
Glucose, Bld: 188 mg/dL — ABNORMAL HIGH (ref 70–99)
Potassium: 4.2 mmol/L (ref 3.5–5.1)
Sodium: 136 mmol/L (ref 135–145)

## 2019-05-06 MED ORDER — ONDANSETRON 4 MG PO TBDP: 4.0000 mg | ORAL_TABLET | Freq: Three times a day (TID) | ORAL | 0 refills | Status: DC | PRN

## 2019-05-06 MED ORDER — SODIUM CHLORIDE 0.9 % IV BOLUS
1000.0000 mL | Freq: Once | INTRAVENOUS | Status: AC
  Administered 2019-05-06: 1000 mL via INTRAVENOUS

## 2019-05-06 MED ORDER — IOHEXOL 350 MG/ML SOLN
75.0000 mL | Freq: Once | INTRAVENOUS | Status: AC | PRN
  Administered 2019-05-06: 75 mL via INTRAVENOUS

## 2019-05-06 MED ORDER — MORPHINE SULFATE (PF) 4 MG/ML IV SOLN
4.0000 mg | Freq: Once | INTRAVENOUS | Status: AC
  Administered 2019-05-06: 4 mg via INTRAVENOUS
  Filled 2019-05-06: qty 1

## 2019-05-06 MED ORDER — ONDANSETRON HCL 4 MG/2ML IJ SOLN
4.0000 mg | Freq: Once | INTRAMUSCULAR | Status: AC
  Administered 2019-05-06: 09:00:00 4 mg via INTRAVENOUS
  Filled 2019-05-06: qty 2

## 2019-05-06 NOTE — ED Triage Notes (Signed)
Pt arrived via POV with reports headache, elevated blood pressure, fever, cough and shortness of breath.  Pt c/o generalized body aches.  Pt states tmax 102 at home.  Pt reports fever x 3 days. Denies any exposure to COVID.  Pt also reports some vomiting as well.

## 2019-05-06 NOTE — Discharge Instructions (Addendum)
Your lab tests, chest xray, and CT scan of the chest were all okay today.  Quarantine yourself at home for the next few days until the results of your coronavirus test are available.  Unfortunately, your MRI scheduled for later today will need to be rescheduled.     Person Under Monitoring Name: Jay Sullivan  Location: [address] [place] [id number]   Infection Prevention Recommendations for Individuals Confirmed to have, or Being Evaluated for, 2019 Novel Coronavirus (COVID-19) Infection Who Receive Care at Home  Individuals who are confirmed to have, or are being evaluated for, COVID-19 should follow the prevention steps below until a healthcare provider or local or state health department says they can return to normal activities.  Stay home except to get medical care You should restrict activities outside your home, except for getting medical care. Do not go to work, school, or public areas, and do not use public transportation or taxis.  Call ahead before visiting your doctor Before your medical appointment, call the healthcare provider and tell them that you have, or are being evaluated for, COVID-19 infection. This will help the healthcare providers office take steps to keep other people from getting infected. Ask your healthcare provider to call the local or state health department.  Monitor your symptoms Seek prompt medical attention if your illness is worsening (e.g., difficulty breathing). Before going to your medical appointment, call the healthcare provider and tell them that you have, or are being evaluated for, COVID-19 infection. Ask your healthcare provider to call the local or state health department.  Wear a facemask You should wear a facemask that covers your nose and mouth when you are in the same room with other people and when you visit a healthcare provider. People who live with or visit you should also wear a facemask while they are in  the same room with you.  Separate yourself from other people in your home As much as possible, you should stay in a different room from other people in your home. Also, you should use a separate bathroom, if available.  Avoid sharing household items You should not share dishes, drinking glasses, cups, eating utensils, towels, bedding, or other items with other people in your home. After using these items, you should wash them thoroughly with soap and water.  Cover your coughs and sneezes Cover your mouth and nose with a tissue when you cough or sneeze, or you can cough or sneeze into your sleeve. Throw used tissues in a lined trash can, and immediately wash your hands with soap and water for at least 20 seconds or use an alcohol-based hand rub.  Wash your [employer]hands Wash your hands often and thoroughly with soap and water for at least 20 seconds. You can use an alcohol-based hand sanitizer if soap and water are not available and if your hands are not visibly dirty. Avoid touching your eyes, nose, and mouth with unwashed hands.   Prevention Steps for Caregivers and Household Members of Individuals Confirmed to have, or Being Evaluated for, COVID-19 Infection Being Cared for in the Home  If you live with, or provide care at home for, a person confirmed to have, or being evaluated for, COVID-19 infection please follow these guidelines to prevent infection:  Follow healthcare providers instructions Make sure that you understand and can help the patient follow any healthcare provider instructions for all care.  Provide for the patients basic needs You should help the patient with basic  needs in the home and provide support for getting groceries, prescriptions, and other personal needs.  Monitor the patients symptoms If they are getting sicker, call his or her medical provider and tell them that the patient has, or is being evaluated for, COVID-19 infection. This will help the healthcare  providers office take steps to keep other people from getting infected. Ask the healthcare provider to call the local or state health department.  Limit the number of people who have contact with the patient If possible, have only one caregiver for the patient. Other household members should stay in another home or place of residence. If this is not possible, they should stay in another room, or be separated from the patient as much as possible. Use a separate bathroom, if available. Restrict visitors who do not have an essential need to be in the home.  Keep older adults, very young children, and other sick people away from the patient Keep older adults, very young children, and those who have compromised immune systems or chronic health conditions away from the patient. This includes people with chronic heart, lung, or kidney conditions, diabetes, and cancer.  Ensure good ventilation Make sure that shared spaces in the home have good air flow, such as from an air conditioner or an opened window, weather permitting.  Wash your hands often Wash your hands often and thoroughly with soap and water for at least 20 seconds. You can use an alcohol based hand sanitizer if soap and water are not available and if your hands are not visibly dirty. Avoid touching your eyes, nose, and mouth with unwashed hands. Use disposable paper towels to dry your hands. If not available, use dedicated cloth towels and replace them when they become wet.  Wear a facemask and gloves Wear a disposable facemask at all times in the room and gloves when you touch or have contact with the patients blood, body fluids, and/or secretions or excretions, such as sweat, saliva, sputum, nasal mucus, vomit, urine, or feces.  Ensure the mask fits over your nose and mouth tightly, and do not touch it during use. Throw out disposable facemasks and gloves after using them. Do not reuse. Wash your hands immediately after removing your  facemask and gloves. If your personal clothing becomes contaminated, carefully remove clothing and launder. Wash your hands after handling contaminated clothing. Place all used disposable facemasks, gloves, and other waste in a lined container before disposing them with other household waste. Remove gloves and wash your hands immediately after handling these items.  Do not share dishes, glasses, or other household items with the patient Avoid sharing household items. You should not share dishes, drinking glasses, cups, eating utensils, towels, bedding, or other items with a patient who is confirmed to have, or being evaluated for, COVID-19 infection. After the person uses these items, you should wash them thoroughly with soap and water.  Wash laundry thoroughly Immediately remove and wash clothes or bedding that have blood, body fluids, and/or secretions or excretions, such as sweat, saliva, sputum, nasal mucus, vomit, urine, or feces, on them. Wear gloves when handling laundry from the patient. Read and follow directions on labels of laundry or clothing items and detergent. In general, wash and dry with the warmest temperatures recommended on the label.  Clean all areas the individual has used often Clean all touchable surfaces, such as counters, tabletops, doorknobs, bathroom fixtures, toilets, phones, keyboards, tablets, and bedside tables, every day. Also, clean any surfaces that may have blood,  body fluids, and/or secretions or excretions on them. Wear gloves when cleaning surfaces the patient has come in contact with. Use a diluted bleach solution (e.g., dilute bleach with 1 part bleach and 10 parts water) or a household disinfectant with a label that says EPA-registered for coronaviruses. To make a bleach solution at home, add 1 tablespoon of bleach to 1 quart (4 cups) of water. For a larger supply, add  cup of bleach to 1 gallon (16 cups) of water. Read labels of cleaning products and  follow recommendations provided on product labels. Labels contain instructions for safe and effective use of the cleaning product including precautions you should take when applying the product, such as wearing gloves or eye protection and making sure you have good ventilation during use of the product. Remove gloves and wash hands immediately after cleaning.  Monitor yourself for signs and symptoms of illness Caregivers and household members are considered close contacts, should monitor their health, and will be asked to limit movement outside of the home to the extent possible. Follow the monitoring steps for close contacts listed on the symptom monitoring form.   ? If you have additional questions, contact your local health department or call the epidemiologist on call at [phone] (available 24/7). ? This guidance is subject to change. For the most up-to-date guidance from [facility], please refer to their website: [email or web address]
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
## 2019-05-06 NOTE — ED Notes (Signed)
Pt is being discharged to home. Pt is Aox4, VSS, pt does not show any signs of distress. AVS/RX was given and explained to the pt and he verbalized understanding of all information.    

## 2019-05-06 NOTE — ED Notes (Signed)
X-ray at bedside

## 2019-05-07 LAB — NOVEL CORONAVIRUS, NAA (HOSP ORDER, SEND-OUT TO REF LAB; TAT 18-24 HRS): SARS-CoV-2, NAA: NOT DETECTED

## 2019-05-11 ENCOUNTER — Ambulatory Visit: Payer: No Typology Code available for payment source

## 2019-05-12 ENCOUNTER — Emergency Department: Payer: BC Managed Care – PPO

## 2019-05-12 ENCOUNTER — Encounter: Payer: Self-pay | Admitting: Emergency Medicine

## 2019-05-12 ENCOUNTER — Other Ambulatory Visit: Payer: Self-pay

## 2019-05-12 ENCOUNTER — Emergency Department
Admission: EM | Admit: 2019-05-12 | Discharge: 2019-05-13 | Disposition: A | Discharge status: Home / Self Care | Payer: BC Managed Care – PPO | Attending: Emergency Medicine | Admitting: Emergency Medicine

## 2019-05-12 DIAGNOSIS — R05 Cough: Secondary | ICD-10-CM

## 2019-05-12 DIAGNOSIS — R509 Fever, unspecified: Secondary | ICD-10-CM

## 2019-05-12 DIAGNOSIS — R0602 Shortness of breath: Secondary | ICD-10-CM | POA: Diagnosis not present

## 2019-05-12 DIAGNOSIS — Z20828 Contact with and (suspected) exposure to other viral communicable diseases: Secondary | ICD-10-CM | POA: Insufficient documentation

## 2019-05-12 DIAGNOSIS — Z79899 Other long term (current) drug therapy: Secondary | ICD-10-CM | POA: Diagnosis not present

## 2019-05-12 DIAGNOSIS — R059 Cough, unspecified: Secondary | ICD-10-CM

## 2019-05-12 LAB — CBC
HCT: 44.1 % (ref 39.0–52.0)
Hemoglobin: 14.5 g/dL (ref 13.0–17.0)
MCH: 29.2 pg (ref 26.0–34.0)
MCHC: 32.9 g/dL (ref 30.0–36.0)
MCV: 88.7 fL (ref 80.0–100.0)
Platelets: 151 10*3/uL (ref 150–400)
RBC: 4.97 MIL/uL (ref 4.22–5.81)
RDW: 13.9 % (ref 11.5–15.5)
WBC: 8.8 10*3/uL (ref 4.0–10.5)
nRBC: 0 % (ref 0.0–0.2)

## 2019-05-12 LAB — BASIC METABOLIC PANEL
Anion gap: 9 (ref 5–15)
BUN: 9 mg/dL (ref 6–20)
CO2: 26 mmol/L (ref 22–32)
Calcium: 9 mg/dL (ref 8.9–10.3)
Chloride: 101 mmol/L (ref 98–111)
Creatinine, Ser: 0.83 mg/dL (ref 0.61–1.24)
GFR calc Af Amer: >60 mL/min (ref >60)
GFR calc non Af Amer: >60 mL/min (ref >60)
Glucose, Bld: 200 mg/dL — ABNORMAL HIGH (ref 70–99)
Potassium: 4.1 mmol/L (ref 3.5–5.1)
Sodium: 136 mmol/L (ref 135–145)

## 2019-05-12 LAB — URINALYSIS, COMPLETE (UACMP) WITH MICROSCOPIC
Bacteria, UA: NONE SEEN
Bilirubin Urine: NEGATIVE
Glucose, UA: 150 mg/dL — AB
Hgb urine dipstick: NEGATIVE
Ketones, ur: NEGATIVE mg/dL
Leukocytes,Ua: NEGATIVE
Nitrite: NEGATIVE
Protein, ur: 30 mg/dL — AB
Specific Gravity, Urine: 1.01 (ref 1.005–1.030)
Squamous Epithelial / LPF: NONE SEEN (ref 0–5)
pH: 6 (ref 5.0–8.0)

## 2019-05-12 LAB — TSH: TSH: 1.435 u[IU]/mL (ref 0.350–4.500)

## 2019-05-12 LAB — LIPID PANEL
Cholesterol: 137 mg/dL (ref 0–200)
HDL: 21 mg/dL — ABNORMAL LOW (ref >40)
LDL Cholesterol: 71 mg/dL (ref 0–99)
Total CHOL/HDL Ratio: 6.5 RATIO
Triglycerides: 227 mg/dL — ABNORMAL HIGH (ref <150)
VLDL: 45 mg/dL — ABNORMAL HIGH (ref 0–40)

## 2019-05-12 LAB — LACTIC ACID, PLASMA: Lactic Acid, Venous: 1 mmol/L (ref 0.5–1.9)

## 2019-05-12 LAB — T4, FREE: Free T4: 1.05 ng/dL (ref 0.61–1.12)

## 2019-05-12 LAB — CK: Total CK: 174 U/L (ref 49–397)

## 2019-05-12 LAB — TROPONIN I (HIGH SENSITIVITY): Troponin I (High Sensitivity): 8 ng/L (ref <18)

## 2019-05-12 LAB — SEDIMENTATION RATE: Sed Rate: 9 mm/hr (ref 0–15)

## 2019-05-12 MED ORDER — OXYCODONE-ACETAMINOPHEN 5-325 MG PO TABS
1.0000 | ORAL_TABLET | Freq: Once | ORAL | Status: AC
  Administered 2019-05-12: 22:00:00 1 via ORAL
  Filled 2019-05-12: qty 1

## 2019-05-12 MED ORDER — SODIUM CHLORIDE 0.9 % IV BOLUS
1000.0000 mL | Freq: Once | INTRAVENOUS | Status: AC
  Administered 2019-05-12: 22:00:00 1000 mL via INTRAVENOUS

## 2019-05-12 MED ORDER — DEXAMETHASONE SODIUM PHOSPHATE 10 MG/ML IJ SOLN
10.0000 mg | Freq: Once | INTRAMUSCULAR | Status: AC
  Administered 2019-05-12: 10 mg via INTRAVENOUS
  Filled 2019-05-12: qty 1

## 2019-05-12 MED ORDER — KETOROLAC TROMETHAMINE 30 MG/ML IJ SOLN
15.0000 mg | INTRAMUSCULAR | Status: AC
  Administered 2019-05-12: 15 mg via INTRAVENOUS
  Filled 2019-05-12: qty 1

## 2019-05-12 NOTE — ED Notes (Signed)
MD at bedside. 

## 2019-05-12 NOTE — ED Provider Notes (Addendum)
[facility] Emergency Department Provider Note  ____________________________________________  Time seen: Approximately 11:57 PM  I have reviewed the triage vital signs and the nursing notes.   HISTORY  Chief Complaint Cough and Shortness of Breath    HPI Jay Sullivan is a 43 y.o. male with a history of obesity, PTSD, peptic ulcer disease, psychogenic spells who comes the ED complaining of  cough congestion chills shortness of breath and chest tightness intermittently for the past 10 days.  Seems to be worse at night.  He reports waking up at night drenched in sweat and having intermittent fevers of 102 but then resolve after a few hours.  Reports going to walk-in clinic where he was sent to the ED for further evaluation.  No specific aggravating or alleviating factors.  No radiating pain.  Not exertional, not pleuritic.  I saw this patient 1 week ago in the emergency department for similar symptoms.  Extensive work-up at that time including CT angiogram of the chest and coronavirus testing was negative for PE, pneumonia, COVID.  He reports persistence of his symptoms.  Denies any trauma or wounds.    Problem list includes factitious illness.  I see records from January 2016 indicating some suspicion of conversion disorder/somatization in addition to anxiety.    History reviewed. No pertinent past medical history.   Patient Active Problem List   Diagnosis Date Noted  . Morbid obesity with BMI of 40.0-44.9, adult ([place]) 04/08/2019  . Carpal tunnel syndrome, bilateral upper limbs 07/29/2016  . Rotator cuff tear, right 07/29/2016  . Depression 12/26/2014  . 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 PTSD (post-traumatic stress disorder) 11/24/2013  . Peptic ulcer disease 11/19/2013  . Chronic pain 11/03/2013  . Abdominal pain 10/19/2013     Past Surgical History:  Procedure Laterality Date  . CARDIAC SURGERY      repaired hole in heart     Prior to Admission medications   Medication Sig Start Date End Date Taking? Authorizing Provider  ibuprofen (ADVIL,MOTRIN) 800 MG tablet Take 1 tablet (800 mg total) by mouth every 8 (eight) hours as needed. 07/02/17   Long, Wonda Olds, MD  methocarbamol (ROBAXIN) 500 MG tablet Take 1 tablet (500 mg total) by mouth 2 (two) times daily. 07/02/17   Long, Wonda Olds, MD  ondansetron (ZOFRAN ODT) 4 MG disintegrating tablet Take 1 tablet (4 mg total) by mouth every 8 (eight) hours as needed for nausea or vomiting. 05/06/19   Carrie Mew, MD  predniSONE (DELTASONE) 20 MG tablet Take 1 tablet (20 mg total) by mouth 2 (two) times daily with a meal. 05/13/19   Carrie Mew, MD     Allergies Amoxicillin-pot clavulanate, Gabapentin, and Oxycodone-acetaminophen   Family History  Problem Relation Age of Onset  . Heart attack Mother   . Stroke Father     Social History Social History   Tobacco Use  . Smoking status: Never Smoker  . Smokeless tobacco: Current User    Types: Chew  Substance Use Topics  . Alcohol use: No  . Drug use: No    Review of Systems  Constitutional: Positive fever and chills.  ENT:   No sore throat. No rhinorrhea. Cardiovascular:   No chest pain or syncope. Respiratory:   Positive shortness of breath and nonproductive cough. Gastrointestinal:   Negative for abdominal pain, vomiting and diarrhea.  Musculoskeletal:   Negative for focal pain or swelling All other systems reviewed and are negative  except as documented above in ROS and HPI.  ____________________________________________   PHYSICAL EXAM:  VITAL SIGNS: ED Triage Vitals  Enc Vitals Group     BP 05/12/19 1810 (!) 146/80     Pulse Rate 05/12/19 1810 (!) 102     Resp 05/12/19 1810 18     Temp 05/12/19 1810 99 F (37.2 C)     Temp Source 05/12/19 1810 Oral     SpO2 05/12/19 1810 97 %     Weight 05/12/19 1811 285 lb (129.3 kg)     Height 05/12/19 1811 _0  (1.778 m)      Head Circumference --      Peak Flow --      Pain Score 05/12/19 1811 4     Pain Loc --      Pain Edu? --      Excl. in [place]? --     Vital signs reviewed, nursing assessments reviewed.   Constitutional:   Alert and oriented. Non-toxic appearance.  Morbidly obese.  Talkative. Eyes:   Conjunctivae are normal. EOMI. PERRL. ENT      Head:   Normocephalic and atraumatic.      Nose:   No congestion/rhinnorhea.       Mouth/Throat:   MMM, no pharyngeal erythema. No peritonsillar mass.       Neck:   No meningismus. Full ROM. Hematological/Lymphatic/Immunilogical:   No cervical lymphadenopathy. Cardiovascular:   RRR. Symmetric bilateral radial and DP pulses.  No murmurs. Cap refill less than 2 seconds. Respiratory:   Normal respiratory effort without tachypnea/retractions. Breath sounds are clear and equal bilaterally. No wheezes/rales/rhonchi.  No inducible wheezing or cough with FEV1 maneuver.  Normal expiratory phase Gastrointestinal:   Soft and nontender. Non distended. There is no CVA tenderness.  No rebound, rigidity, or guarding. Musculoskeletal:   Normal range of motion in all extremities. No joint effusions.  No lower extremity tenderness.  No edema. Neurologic:   Normal speech and language.  Motor grossly intact. No acute focal neurologic deficits are appreciated.  Skin:    Skin is warm, dry and intact. No rash noted.  No petechiae, purpura, or bullae.  ____________________________________________    LABS (pertinent positives/negatives) (all labs ordered are listed, but only abnormal results are displayed) Labs Reviewed  BASIC METABOLIC PANEL - Abnormal; Notable for the following components:      Result Value   Glucose, Bld 200 (*)    All other components within normal limits  URINALYSIS, COMPLETE (UACMP) WITH MICROSCOPIC - Abnormal; Notable for the following components:   Color, Urine YELLOW (*)    APPearance CLEAR (*)    Glucose, UA 150 (*)    Protein, ur 30 (*)     All other components within normal limits  LIPID PANEL - Abnormal; Notable for the following components:   Triglycerides 227 (*)    HDL 21 (*)    VLDL 45 (*)    All other components within normal limits  CULTURE, BLOOD (ROUTINE X 2)  CULTURE, BLOOD (ROUTINE X 2)  URINE CULTURE  SARS CORONAVIRUS 2 (HOSPITAL ORDER, [facility])  CBC  T4, FREE  TSH  CK  SEDIMENTATION RATE  LACTIC ACID, PLASMA  LACTIC ACID, PLASMA  HEMOGLOBIN A1C  METANEPHRINES, PLASMA  TROPONIN I (HIGH SENSITIVITY)   ____________________________________________   EKG  Interpreted by me Normal sinus rhythm rate of 95, normal axis and intervals.  Normal QRS ST segments and T waves.  ____________________________________________    [id number]  Dg Chest Port 1 View  Result Date: 05/12/2019 CLINICAL DATA:  Cough, congestion, shortness of breath EXAM: PORTABLE CHEST 1 VIEW COMPARISON:  05/06/2019 FINDINGS: Heart and mediastinal contours are within normal limits. No focal opacities or effusions. No acute bony abnormality. IMPRESSION: No active disease. Electronically Signed   By: Rolm Baptise M.D.   On: 05/12/2019 21:23    ____________________________________________   PROCEDURES Procedures  ____________________________________________    CLINICAL IMPRESSION / ASSESSMENT AND PLAN / ED COURSE  Medications ordered in the ED: Medications  sodium chloride 0.9 % bolus 1,000 mL (1,000 mLs Intravenous New Bag/Given 05/12/19 2210)  ketorolac (TORADOL) 30 MG/ML injection 15 mg (15 mg Intravenous Given 05/12/19 2210)  dexamethasone (DECADRON) injection 10 mg (10 mg Intravenous Given 05/12/19 2210)  oxyCODONE-acetaminophen (PERCOCET/ROXICET) 5-325 MG per tablet 1 tablet (1 tablet Oral Given 05/12/19 2220)    Pertinent labs & imaging results that were available during my care of the patient were reviewed by me and considered in my medical decision making (see chart for details).  Davi Kroon was evaluated in Emergency Department on 05/13/2019 for the symptoms described in the history of present illness. He was evaluated in the context of the global COVID-19 pandemic, which necessitated consideration that the patient might be at risk for infection with the SARS-CoV-2 virus that causes COVID-19. Institutional protocols and algorithms that pertain to the evaluation of patients at risk for COVID-19 are in a state of rapid change based on information released by regulatory bodies including the CDC and federal and state organizations. These policies and algorithms were followed during the patient's care in the ED.   Patient presents with persistent reported symptoms of shortness of breath cough fevers chills and night sweats.  A week ago he had a very extensive work-up which was all reassuring.  Today's vital signs and exam are unremarkable.  This is either a subclinical case of COVID, and atypical presentation of other less common illness such as hyperthyroidism, pheochromocytoma, occult bacteremia, rhabdomyolysis, or is possibly a somatoform disorder or factitious illness.  I will undertake extensive work-up including repeat COVID testing, lipids, A1c, CK, ESR, metanephrines, TSH, cultures  ----------------------------------------- 12:06 AM on 05/13/2019 ----------------------------------------- CK, TSH, lactate, ESR, labs all unremarkable.  Troponin was sent by nursing triage protocol but symptoms are noncardiac and no further cardiac work-up is warranted.  Lipids are elevated and patient would likely benefit from a low-dose statin but this should be undertaken by primary care who can do follow-up monitoring of symptoms and labs as needed.  Glucose is mildly elevated at 200.  This was identified by primary care as well.  Would not start metformin at this time given that this is likely elevated from his current illness and only mildly so.  A1c pending.  He will need continue primary care  follow-up.  Urine looks totally normal.  Chest x-ray and chemistries today also normal.  Care of patient signed out to Dr. Owens Shark with anticipated discharge home and primary care follow-up if no further concerning results are found.  I would have the patient take a low-dose prednisone over the next couple of days to see if this helps with his symptoms.       ____________________________________________   FINAL CLINICAL IMPRESSION(S) / ED DIAGNOSES    Final diagnoses:  Fever, unspecified fever cause  Cough     ED Discharge Orders         Ordered    predniSONE (DELTASONE) 20 MG tablet  2 times  daily with meals     05/13/19 0009          Portions of this note were generated with dragon dictation software. Dictation errors may occur despite best attempts at proofreading.   Carrie Mew, MD 05/13/19 0009    ----------------------------------------- 7:49 AM on 05/16/2019 -----------------------------------------  Called patient at home yesterday at 3:00 PM.  No answer at his home, patient cell, or spouse cell phone numbers.  I wish to check on his symptoms and advised him of his elevated A1c necessitating metformin initiation.    Carrie Mew, MD 05/16/19 [phone]

## 2019-05-12 NOTE — ED Triage Notes (Signed)
Pt presents to ED c/o cough, congestion, chills, SOB, and chest tightness since 7/6. Pt seen in ED 7/9 and tested for COVID (negative) but reports symptoms have not improved. Pt noted to have to pause during speaking to take breaths.

## 2019-05-13 LAB — LACTIC ACID, PLASMA: Lactic Acid, Venous: 1.4 mmol/L (ref 0.5–1.9)

## 2019-05-13 LAB — HEMOGLOBIN A1C
Hgb A1c MFr Bld: 8.7 % — ABNORMAL HIGH (ref 4.8–5.6)
Mean Plasma Glucose: 202.99 mg/dL

## 2019-05-13 LAB — SARS CORONAVIRUS 2 BY RT PCR (HOSPITAL ORDER, PERFORMED IN ~~LOC~~ HOSPITAL LAB): SARS Coronavirus 2: NEGATIVE

## 2019-05-13 MED ORDER — PREDNISONE 20 MG PO TABS: 20.0000 mg | ORAL_TABLET | Freq: Two times a day (BID) | ORAL | 0 refills | Status: DC

## 2019-05-13 MED ORDER — OXYCODONE HCL 5 MG PO TABS
10.0000 mg | ORAL_TABLET | Freq: Once | ORAL | Status: AC
  Administered 2019-05-13: 10 mg via ORAL
  Filled 2019-05-13: qty 2

## 2019-05-13 MED ORDER — OXYCODONE HCL 5 MG PO TABS: 10.0000 mg | ORAL_TABLET | Freq: Four times a day (QID) | ORAL | 0 refills | Status: DC | PRN

## 2019-05-13 NOTE — ED Notes (Signed)
Pt sitting in chair in room, pt asking for dose of percocet, states he is prescribed them for his knee pain.  Informed Dr. Owens Shark.  Dr. Owens Shark in room to talk with patient.

## 2019-05-14 LAB — URINE CULTURE: Culture: NO GROWTH

## 2019-05-17 LAB — CULTURE, BLOOD (ROUTINE X 2)
Culture: NO GROWTH
Culture: NO GROWTH

## 2019-05-18 LAB — METANEPHRINES, PLASMA
Metanephrine, Free: 25.6 pg/mL (ref 0.0–88.0)
Normetanephrine, Free: 149.8 pg/mL — ABNORMAL HIGH (ref 0.0–125.8)

## 2019-05-21 ENCOUNTER — Ambulatory Visit
Admission: RE | Admit: 2019-05-21 | Discharge: 2019-05-21 | Disposition: A | Discharge status: Home / Self Care | Payer: BC Managed Care – PPO | Source: Ambulatory Visit | Attending: Orthopedic Surgery | Admitting: Orthopedic Surgery

## 2019-05-21 ENCOUNTER — Other Ambulatory Visit: Payer: Self-pay

## 2019-05-21 DIAGNOSIS — E119 Type 2 diabetes mellitus without complications: Secondary | ICD-10-CM | POA: Insufficient documentation

## 2019-05-21 DIAGNOSIS — I10 Essential (primary) hypertension: Secondary | ICD-10-CM | POA: Insufficient documentation

## 2019-05-21 DIAGNOSIS — S8991XA Unspecified injury of right lower leg, initial encounter: Secondary | ICD-10-CM | POA: Diagnosis not present

## 2019-07-06 ENCOUNTER — Other Ambulatory Visit: Payer: Self-pay

## 2019-07-06 ENCOUNTER — Encounter: Payer: Self-pay | Admitting: Student in an Organized Health Care Education/Training Program

## 2019-07-06 ENCOUNTER — Ambulatory Visit
Payer: BC Managed Care – PPO | Attending: Student in an Organized Health Care Education/Training Program | Admitting: Student in an Organized Health Care Education/Training Program

## 2019-07-06 VITALS — BP 123/85 | HR 97 | Temp 98.1°F | Resp 18 | Ht 70.0 in | Wt 268.0 lb

## 2019-07-06 DIAGNOSIS — G894 Chronic pain syndrome: Secondary | ICD-10-CM | POA: Diagnosis present

## 2019-07-06 DIAGNOSIS — M4306 Spondylolysis, lumbar region: Secondary | ICD-10-CM

## 2019-07-06 DIAGNOSIS — Z981 Arthrodesis status: Secondary | ICD-10-CM | POA: Diagnosis not present

## 2019-07-06 DIAGNOSIS — M47816 Spondylosis without myelopathy or radiculopathy, lumbar region: Secondary | ICD-10-CM | POA: Diagnosis not present

## 2019-07-06 DIAGNOSIS — M1711 Unilateral primary osteoarthritis, right knee: Secondary | ICD-10-CM | POA: Diagnosis not present

## 2019-07-06 DIAGNOSIS — M5136 Other intervertebral disc degeneration, lumbar region: Secondary | ICD-10-CM | POA: Insufficient documentation

## 2019-07-06 MED ORDER — METHOCARBAMOL 750 MG PO TABS: 750.0000 mg | ORAL_TABLET | Freq: Three times a day (TID) | ORAL | 0 refills | Status: DC | PRN

## 2019-07-06 NOTE — Patient Instructions (Addendum)
____________________________________________________________________________________________  Genicular Nerve Block  What is a genicular nerve block? A genicular nerve block is the injection of a local anesthetic to block the nerves that transmits pain from the knee.  What is the purpose of a facet nerve block? A genicular nerve block is a diagnostic procedure to determine if the pathologic changes (i.e. arthritis, meniscal tears, etc) and inflammation within the knee joint is the source of your knee pain. It also confirms that the knee pain will respond well to the actual treatment procedure. If a genicular nerve block works, it will give you relief for several hours. After that, the pain is expected to return to normal. This test is always performed twice (usually a week or two apart) because two successful tests are required to move onto treatment. If both diagnostic tests are positive, then we schedule a treatment called radiofrequency (RF) ablation. In this procedure, the same nerves are cauterized, which typically leads to pain relief for 4 -18 months. If this process works well for one knee, it can be performed on the other knee if needed.  How is the procedure performed? You will be placed on the procedure table. The injection site is sterilized with either iodine or chlorhexadine. The site to be injected is numbed with a local anesthetic, and a needle is directed to the target area. X-ray guidance is used to ensure proper placement and positioning of the needle. When the needle is properly positioned near the genicular nerve, local anesthetic is injected to numb that nerve. This will be repeated at multiple sites around the knee to block all genicular nerves.  Will the procedure be painful? The injection can be painful and we therefore provide the option of receiving IV sedation. IV sedation, combined with local anesthetic, can make the injection nearly pain free. It allows you to remain very  still during the procedure, which can also make the injection easier, faster, and more successful. If you decide to have IV sedation, you must have a driver to get you home safely afterwards. In addition, you cannot have anything to eat or drink within 8 hours of your appointment (clear liquids are allowed until 3 hours before the procedure). If you take medications for diabetes, these medications may need to be adjusted the morning of the procedure. Your primary care physician can help you with this adjustment.  What are the discharge instructions? If you received IV sedation do not drive or operate machinery for at least 24 hours after the procedure. You may return to work the next day following your procedure. You may resume your normal diet immediately. Do not engage in any strenuous activity for 24 hours. You should, however, engage in moderate activity that typically causes your ususal pain. If the block works, those activities should not be painful for several hours after the injection. Do not take a bath, swim, or use a hot tub for 24 hours (you may take a shower). Call the office if you have any of the following: severe pain afterwards (different than your usual symptoms), redness/swelling/discharge at the injection site(s), fevers/chills, difficulty with bowel or bladder functions.  What are the risks and side effects? The complication rate for this procedure is very low. Whenever a needle enters the skin, bleeding or infection can occur. Some other serious but extremely rare risks include paralysis and death. You may have an allergic reaction to any of the medications used. If you have a known allergy to any medications, especially local anesthetics, notify  our staff before the procedure takes place. You may experience any of the following side effects up to 4 - 6 hours after the procedure: . Leg muscle weakness or numbness may occur due to the local anesthetic affecting the nerves that control  your legs (this is a temporary affect and it is not paralysis). If you have any leg weakness or numbness, walk only with assistance in order to prevent falls and injury. Your leg strength will return slowly and completely. . Dizziness may occur due to a decrease in your blood pressure. If this occurs, remain in a seated or lying position. Gradually sit up, and then stand after at least 10 minutes of sitting. . Mild headaches may occur. Drink fluids and take pain medications if needed. If the headaches persist or become severe, call the office. . Mild discomfort at the injection site can occur. This typically lasts for a few hours but can persist for a couple days. If this occurs, take anti-inflammatories or pain medications, apply ice to the area the day of the procedure. If it persists, apply moist heat in the day(s) following.  The side effects listed above can be normal. They are not dangerous and will resolve on their own. If, however, you experience any of the following, a complication may have occurred and you should either contact your doctor. If he is not readily available, then you should proceed to the closest urgent care center for evaluation: . Severe or progressive pain at the injection site(s) . Arm or leg weakness that progressively worsens or persists for longer than 8 hours . Severe or progressive redness, swelling, or discharge from the injections site(s) . Fevers, chills, nausea, or vomiting . Bowel or bladder dysfunction (i.e. inability to urinate or pass stool or difficulty controlling either)  How long does it take for the procedure to work? You should feel relief from your usual pain within the first hour. Again, this is only expected to last for several hours, at the most. Remember, you may be sore in the middle part of your back from the needles, and you must distinguish this from your usual  pain. ____________________________________________________________________________________________  ____________________________________________________________________________________________  Preparing for Procedure with Sedation  Procedure appointments are limited to planned procedures: . No Prescription Refills. . No disability issues will be discussed. . No medication changes will be discussed.  Instructions: . Oral Intake: Do not eat or drink anything for at least 8 hours prior to your procedure. . Transportation: Public transportation is not allowed. Bring an adult driver. The driver must be physically present in our waiting room before any procedure can be started. Marland Kitchen. Physical Assistance: Bring an adult physically capable of assisting you, in the event you need help. This adult should keep you company at home for at least 6 hours after the procedure. . Blood Pressure Medicine: Take your blood pressure medicine with a sip of water the morning of the procedure. . Blood thinners: Notify our staff if you are taking any blood thinners. Depending on which one you take, there will be specific instructions on how and when to stop it. . Diabetics on insulin: Notify the staff so that you can be scheduled 1st case in the morning. If your diabetes requires high dose insulin, take only  of your normal insulin dose the morning of the procedure and notify the staff that you have done so. . Preventing infections: Shower with an antibacterial soap the morning of your procedure. . Build-up your immune system: Take 1000  mg of Vitamin C with every meal (3 times a day) the day prior to your procedure. Marland Kitchen Antibiotics: Inform the staff if you have a condition or reason that requires you to take antibiotics before dental procedures. . Pregnancy: If you are pregnant, call and cancel the procedure. . Sickness: If you have a cold, fever, or any active infections, call and cancel the procedure. . Arrival: You  must be in the facility at least 30 minutes prior to your scheduled procedure. . Children: Do not bring children with you. . Dress appropriately: Bring dark clothing that you would not mind if they get stained. . Valuables: Do not bring any jewelry or valuables.  Reasons to call and reschedule or cancel your procedure: (Following these recommendations will minimize the risk of a serious complication.) . Surgeries: Avoid having procedures within 2 weeks of any surgery. (Avoid for 2 weeks before or after any surgery). . Flu Shots: Avoid having procedures within 2 weeks of a flu shots or . (Avoid for 2 weeks before or after immunizations). . Barium: Avoid having a procedure within 7-10 days after having had a radiological study involving the use of radiological contrast. (Myelograms, Barium swallow or enema study). . Heart attacks: Avoid any elective procedures or surgeries for the initial 6 months after a "Myocardial Infarction" (Heart Attack). . Blood thinners: It is imperative that you stop these medications before procedures. Let us know if you if you take any blood thinner.  . Infection: Avoid procedures during or within two weeks of an infection (including chest colds or gastrointestinal problems). Symptoms associated with infections include: Localized redness, fever, chills, night sweats or profuse sweating, burning sensation when voiding, cough, congestion, stuffiness, runny nose, sore throat, diarrhea, nausea, vomiting, cold or Flu symptoms, recent or current infections. It is specially important if the infection is over the area that we intend to treat. Marland Kitchen Heart and lung problems: Symptoms that may suggest an active cardiopulmonary problem include: cough, chest pain, breathing difficulties or shortness of breath, dizziness, ankle swelling, uncontrolled high or unusually low blood pressure, and/or palpitations. If you are experiencing any of these symptoms, cancel your procedure and contact your  primary care physician for an evaluation.  Remember:  Regular Business hours are:  Monday to Thursday 8:00 AM to 4:00 PM  Provider's Schedule: Milinda Pointer, MD:  Procedure days: Tuesday and Thursday 7:30 AM to 4:00 PM  Gillis Santa, MD:  Procedure days: Monday and Wednesday 7:30 AM to 4:00 PM ____________________________________________________________________________________________

## 2019-07-06 NOTE — Progress Notes (Signed)
Safety precautions to be maintained throughout the outpatient stay will include: orient to surroundings, keep bed in low position, maintain call bell within reach at all times, provide assistance with transfer out of bed and ambulation.  
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
## 2019-07-08 LAB — COMPLIANCE DRUG ANALYSIS, UR

## 2019-08-04 ENCOUNTER — Encounter: Payer: Self-pay | Admitting: Student in an Organized Health Care Education/Training Program

## 2019-08-04 ENCOUNTER — Ambulatory Visit (HOSPITAL_BASED_OUTPATIENT_CLINIC_OR_DEPARTMENT_OTHER): Payer: BC Managed Care – PPO | Admitting: Student in an Organized Health Care Education/Training Program

## 2019-08-04 ENCOUNTER — Other Ambulatory Visit: Payer: Self-pay | Admitting: Student in an Organized Health Care Education/Training Program

## 2019-08-04 ENCOUNTER — Ambulatory Visit
Admission: RE | Admit: 2019-08-04 | Discharge: 2019-08-04 | Disposition: A | Discharge status: Home / Self Care | Payer: BC Managed Care – PPO | Source: Ambulatory Visit | Attending: Student in an Organized Health Care Education/Training Program | Admitting: Student in an Organized Health Care Education/Training Program

## 2019-08-04 ENCOUNTER — Other Ambulatory Visit: Payer: Self-pay

## 2019-08-04 VITALS — BP 132/99 | HR 85 | Temp 98.2°F | Resp 18 | Ht 71.0 in | Wt 245.0 lb

## 2019-08-04 DIAGNOSIS — M1711 Unilateral primary osteoarthritis, right knee: Secondary | ICD-10-CM

## 2019-08-04 MED ORDER — DEXAMETHASONE SODIUM PHOSPHATE 10 MG/ML IJ SOLN
10.0000 mg | Freq: Once | INTRAMUSCULAR | Status: AC
  Administered 2019-08-04: 09:00:00 10 mg
  Filled 2019-08-04: qty 1

## 2019-08-04 MED ORDER — LIDOCAINE HCL 2 % IJ SOLN
20.0000 mL | Freq: Once | INTRAMUSCULAR | Status: AC
  Administered 2019-08-04: 400 mg
  Filled 2019-08-04: qty 40

## 2019-08-04 MED ORDER — ROPIVACAINE HCL 2 MG/ML IJ SOLN
1.0000 mL | Freq: Once | INTRAMUSCULAR | Status: AC
  Administered 2019-08-04: 1 mL via EPIDURAL
  Filled 2019-08-04: qty 10

## 2019-08-04 NOTE — Patient Instructions (Signed)

## 2019-08-04 NOTE — Progress Notes (Signed)
Patient's Name: Jay Sullivan  MRN: [id number]  [facility]  DOB: [birth year]  [facility]  DOS: 08/04/2019  Note by: Edward JollyBilal Joud Pettinato, MD  Service setting: Ambulatory outpatient  Specialty: Interventional Pain Management  Patient type: Established  Location: [facility] (AMB) Pain Management Facility  Visit type: Interventional Procedure   Primary Reason for Visit: Interventional Pain Management Treatment. CC: Knee Pain (right)  Procedure:          Anesthesia, Analgesia, Anxiolysis:  Type: Genicular Nerves Block (Superior-lateral, Superior-medial, and Inferior-medial Genicular Nerves) #1  CPT: 64450      Primary Purpose: Diagnostic Region: Lateral, Anterior, and Medial aspects of the knee joint, above and below the knee joint proper. Level: Superior and inferior to the knee joint. Target Area: For Genicular Nerve block(s), the targets are: the superior-lateral genicular nerve, located in the lateral distal portion of the femoral shaft as it curves to form the lateral epicondyle, in the region of the distal femoral metaphysis; the superior-medial genicular nerve, located in the medial distal portion of the femoral shaft as it curves to form the medial epicondyle; and the inferior-medial genicular nerve, located in the medial, proximal portion of the tibial shaft, as it curves to form the medial epicondyle, in the region of the proximal tibial metaphysis. Approach: Anterior, percutaneous, ipsilateral approach. Laterality: Right knee  Type: Local Anesthesia  Local Anesthetic: Lidocaine 1-2%  Position: Modified Fowler's position with pillows under the targeted knee(s).   Indications: 1. Primary osteoarthritis of right knee    Pain Score: Pre-procedure: 8 /10 Post-procedure: 0-No pain/10   Pre-op Assessment:  Jay Sullivan is a 43 y.o. (year old), male patient, seen today for interventional treatment. He  has a past surgical history that includes Cardiac  surgery; Joint replacement; carapal; 5 shoulder surgery; back surgery; and Ankle surgery. Jay Sullivan has a current medication list which includes the following prescription(s): aspirin ec, empagliflozin, ibuprofen, lisinopril, methocarbamol, mirtazapine, zolpidem, clonazepam, metformin, metronidazole, ondansetron, and oxycodone. His primarily concern today is the Knee Pain (right)  Initial Vital Signs:  Pulse/HCG Rate: 93  Temp: 98.2 F (36.8 C) Resp: 18 BP: 140/80 SpO2: 98 %  BMI: Estimated body mass index is 34.17 kg/m as calculated from the following:   Height as of this encounter: 5\' 11"  (1.803 m).   Weight as of this encounter: 245 lb (111.1 kg).  Risk Assessment: Allergies: Reviewed. He is allergic to amoxicillin-pot clavulanate; gabapentin; and oxycodone-acetaminophen.  Allergy Precautions: None required Coagulopathies: Reviewed. None identified.  Blood-thinner therapy: None at this time Active Infection(s): Reviewed. None identified. Jay Sullivan is afebrile  Site Confirmation: Jay Sullivan was asked to confirm the procedure and laterality before marking the site Procedure checklist: Completed Consent: Before the procedure and under the influence of no sedative(s), amnesic(s), or anxiolytics, the patient was informed of the treatment options, risks and possible complications. To fulfill our ethical and legal obligations, as recommended by the American Medical Association's Code of Ethics, I have informed the patient of my clinical impression; the nature and purpose of the treatment or procedure; the risks, benefits, and possible complications of the intervention; the alternatives, including doing nothing; the risk(s) and benefit(s) of the alternative treatment(s) or procedure(s); and the risk(s) and benefit(s) of doing nothing. The patient was provided information about the general risks and possible complications associated with the procedure. These may include, but are not limited to:  failure to achieve desired goals, infection, bleeding, organ or nerve damage, allergic reactions, paralysis, and  death. In addition, the patient was informed of those risks and complications associated to the procedure, such as failure to decrease pain; infection; bleeding; organ or nerve damage with subsequent damage to sensory, motor, and/or autonomic systems, resulting in permanent pain, numbness, and/or weakness of one or several areas of the body; allergic reactions; (i.e.: anaphylactic reaction); and/or death. Furthermore, the patient was informed of those risks and complications associated with the medications. These include, but are not limited to: allergic reactions (i.e.: anaphylactic or anaphylactoid reaction(s)); adrenal axis suppression; blood sugar elevation that in diabetics may result in ketoacidosis or comma; water retention that in patients with history of congestive heart failure may result in shortness of breath, pulmonary edema, and decompensation with resultant heart failure; weight gain; swelling or edema; medication-induced neural toxicity; particulate matter embolism and blood vessel occlusion with resultant organ, and/or nervous system infarction; and/or aseptic necrosis of one or more joints. Finally, the patient was informed that Medicine is not an exact science; therefore, there is also the possibility of unforeseen or unpredictable risks and/or possible complications that may result in a catastrophic outcome. The patient indicated having understood very clearly. We have given the patient no guarantees and we have made no promises. Enough time was given to the patient to ask questions, all of which were answered to the patient's satisfaction. [name] has indicated that he wanted to continue with the procedure. Attestation: I, the ordering provider, attest that I have discussed with the patient the benefits, risks, side-effects, alternatives, likelihood of achieving goals, and  potential problems during recovery for the procedure that I have provided informed consent. Date  Time: 08/04/2019  8:14 AM  Pre-Procedure Preparation:  Monitoring: As per clinic protocol. Respiration, ETCO2, SpO2, BP, heart rate and rhythm monitor placed and checked for adequate function Safety Precautions: Patient was assessed for positional comfort and pressure points before starting the procedure. Time-out: I initiated and conducted the "Time-out" before starting the procedure, as per protocol. The patient was asked to participate by confirming the accuracy of the "Time Out" information. Verification of the correct person, site, and procedure were performed and confirmed by me, the nursing staff, and the patient. "Time-out" conducted as per Joint Commission's Universal Protocol (UP.01.01.01). Time: 0904  Description of Procedure:          Area Prepped: Entire knee area, from mid-thigh to mid-shin, lateral, anterior, and medial aspects. Prepping solution: DuraPrep (Iodine Povacrylex [0.7% available iodine] and Isopropyl Alcohol, 74% w/w) Safety Precautions: Aspiration looking for blood return was conducted prior to all injections. At no point did we inject any substances, as a needle was being advanced. No attempts were made at seeking any paresthesias. Safe injection practices and needle disposal techniques used. Medications properly checked for expiration dates. SDV (single dose vial) medications used. Description of the Procedure: Protocol guidelines were followed. The patient was placed in position over the procedure table. The target area was identified and the area prepped in the usual manner. Skin & deeper tissues infiltrated with local anesthetic. Appropriate amount of time allowed to pass for local anesthetics to take effect. The procedure needles were then advanced to the target area. Proper needle placement secured. Negative aspiration confirmed. Solution injected in intermittent fashion,  asking for systemic symptoms every 0.5cc of injectate. The needles were then removed and the area cleansed, making sure to leave some of the prepping solution back to take advantage of its long term bactericidal properties.  Vitals:   08/04/19 0821 08/04/19 0859 08/04/19 0900  08/04/19 0910  BP: 140/80 (!) 138/96 (!) 136/100 (!) 132/99  Pulse: 93 90 85 85  Resp: 18 20 16 18   Temp: 98.2 F (36.8 C)     SpO2: 98% 97% 96% 95%  Weight: 245 lb (111.1 kg)     Height: 5\' 11"  (1.803 m)       Start Time: 0904 hrs. End Time: 0912 hrs. Materials:  Needle(s) Type: Spinal Needle Gauge: 22G Length: 3.5-in Medication(s): Please see orders for medications and dosing details.  5 cc solution made of 4 cc of 0.2% ropivacaine, 1 cc of Decadron 10 mg/cc.  1.5 cc injected at each level above for right knee.  Imaging Guidance (Non-Spinal):          Type of Imaging Technique: Fluoroscopy Guidance (Non-Spinal) Indication(s): Assistance in needle guidance and placement for procedures requiring needle placement in or near specific anatomical locations not easily accessible without such assistance. Exposure Time: Please see nurses notes. Contrast: None used. Fluoroscopic Guidance: I was personally present during the use of fluoroscopy. "Tunnel Vision Technique" used to obtain the best possible view of the target area. Parallax error corrected before commencing the procedure. "Direction-depth-direction" technique used to introduce the needle under continuous pulsed fluoroscopy. Once target was reached, antero-posterior, oblique, and lateral fluoroscopic projection used confirm needle placement in all planes. Images permanently stored in EMR. Interpretation: No contrast injected. I personally interpreted the imaging intraoperatively. Adequate needle placement confirmed in multiple planes. Permanent images saved into the patient's record.  Antibiotic Prophylaxis:   Anti-infectives (From admission, onward)   None      Indication(s): None identified  Post-operative Assessment:  Post-procedure Vital Signs:  Pulse/HCG Rate: 85  Temp: 98.2 F (36.8 C) Resp: 18 BP: (!) 132/99 SpO2: 95 %  EBL: None  Complications: No immediate post-treatment complications observed by team, or reported by patient.  Note: The patient tolerated the entire procedure well. A repeat set of vitals were taken after the procedure and the patient was kept under observation following institutional policy, for this type of procedure. Post-procedural neurological assessment was performed, showing return to baseline, prior to discharge. The patient was provided with post-procedure discharge instructions, including a section on how to identify potential problems. Should any problems arise concerning this procedure, the patient was given instructions to immediately contact , at any time, without hesitation. In any case, we plan to contact the patient by telephone for a follow-up status report regarding this interventional procedure.  Comments:  No additional relevant information.  Plan of Care  Orders:  Orders Placed This Encounter  Procedures  . DG PAIN CLINIC C-ARM 1-60 MIN NO REPORT    Intraoperative interpretation by procedural physician at [facility].    Standing Status:   Standing    Number of Occurrences:   1    Order Specific Question:   Reason for exam:    Answer:   Assistance in needle guidance and placement for procedures requiring needle placement in or near specific anatomical locations not easily accessible without such assistance.    Medications ordered for procedure: Meds ordered this encounter  Medications  . lidocaine (XYLOCAINE) 2 % (with pres) injection 400 mg  . dexamethasone (DECADRON) injection 10 mg  . ropivacaine (PF) 2 mg/mL (0.2%) (NAROPIN) injection 1 mL   Medications administered: We administered lidocaine, dexamethasone, and ropivacaine (PF) 2 mg/mL (0.2%).  See the medical  record for exact dosing, route, and time of administration.  Follow-up plan:   Return in about 3 weeks (  around 08/25/2019) for Post Procedure Evaluation.      Status post right knee genicular nerve block diagnostic on 08/04/2019   Recent Visits Date Type Provider Dept  07/06/19 Office Visit [name], MD [facility]  Showing recent visits within past 90 days and meeting all other requirements   Today's Visits Date Type Provider Dept  08/04/19 Procedure visit [name], MD [facility]  Showing today's visits and meeting all other requirements   Future Appointments Date Type Provider Dept  08/25/19 Appointment [name], MD [facility]  Showing future appointments within next 90 days and meeting all other requirements   Disposition: Discharge home  Discharge Date & Time: 08/04/2019; 0916 hrs.   Primary Care Physician: [facility] Location: [facility] Note by: [name], MD Date: 08/04/2019; Time: 11:34 AM  Disclaimer:  Medicine is not an exact science. The only guarantee in medicine is that nothing is guaranteed. It is important to note that the decision to proceed with this intervention was based on the information collected from the patient. The Data and conclusions were drawn from the patient's questionnaire, the interview, and the physical examination. Because the information was provided in large part by the patient, it cannot be guaranteed that it has not been purposely or unconsciously manipulated. Every effort has been made to obtain as much relevant data as possible for this evaluation. It is important to note that the conclusions that lead to this procedure are derived in large part from the available data. Always take into account that the treatment will also be dependent on availability of resources and existing treatment guidelines, considered by other Pain Management Practitioners as being common  knowledge and practice, at the time of the intervention. For Medico-Legal purposes, it is also important to point out that variation in procedural techniques and pharmacological choices are the acceptable norm. The indications, contraindications, technique, and results of the above procedure should only be interpreted and judged by a Board-Certified Interventional Pain Specialist with extensive familiarity and expertise in the same exact procedure and technique.

## 2019-08-04 NOTE — Progress Notes (Signed)
Safety precautions to be maintained throughout the outpatient stay will include: orient to surroundings, keep bed in low position, maintain call bell within reach at all times, provide assistance with transfer out of bed and ambulation.  

## 2019-08-05 ENCOUNTER — Telehealth: Payer: Self-pay

## 2019-08-05 ENCOUNTER — Telehealth: Payer: Self-pay | Admitting: *Deleted

## 2019-08-05 NOTE — Telephone Encounter (Signed)
Dr Holley Raring has called patient and addressed.

## 2019-08-05 NOTE — Telephone Encounter (Signed)
During patient callback, he reports that knee is very swollen and painful.  Had GNB done yesterday. Swollen like a football. Patient states that apprx 2 hours after procedure pain started.  He did use ice all day and over night.  This morning he could barely walk on the affected leg.  I have communicated with DR Holley Raring and he will see him today.  Patient states he is unable to get her today.  Asked if he had capability to do facetime call and he does.  So maybe a virtual visit will work best.  Will call him back when I talk to dr Holley Raring.

## 2019-08-09 ENCOUNTER — Telehealth: Payer: Self-pay | Admitting: Student in an Organized Health Care Education/Training Program

## 2019-08-09 NOTE — Telephone Encounter (Signed)
Patient states we were supposed to fax a note to his work for [employer] and Friday off. His work says they have not received. Please call patient when this is sent.   Fax [phone]  Email [email or web address]

## 2019-08-09 NOTE — Telephone Encounter (Signed)
Note faxed to requested number. Patient notified.

## 2019-08-24 ENCOUNTER — Encounter: Payer: Self-pay | Admitting: Student in an Organized Health Care Education/Training Program

## 2019-08-25 ENCOUNTER — Encounter: Payer: Self-pay | Admitting: Student in an Organized Health Care Education/Training Program

## 2019-08-25 ENCOUNTER — Ambulatory Visit
Payer: BC Managed Care – PPO | Attending: Student in an Organized Health Care Education/Training Program | Admitting: Student in an Organized Health Care Education/Training Program

## 2019-08-25 ENCOUNTER — Other Ambulatory Visit: Payer: Self-pay

## 2019-08-25 DIAGNOSIS — Z981 Arthrodesis status: Secondary | ICD-10-CM

## 2019-08-25 DIAGNOSIS — M1711 Unilateral primary osteoarthritis, right knee: Secondary | ICD-10-CM

## 2019-08-25 DIAGNOSIS — G894 Chronic pain syndrome: Secondary | ICD-10-CM | POA: Diagnosis not present

## 2019-08-25 DIAGNOSIS — M5136 Other intervertebral disc degeneration, lumbar region: Secondary | ICD-10-CM

## 2019-08-25 DIAGNOSIS — M47816 Spondylosis without myelopathy or radiculopathy, lumbar region: Secondary | ICD-10-CM | POA: Diagnosis not present

## 2019-08-25 NOTE — Progress Notes (Signed)
Pain Management Virtual Encounter Note - Virtual Visit via Telephone Telehealth (real-time audio visits between healthcare provider and patient).   Patient's Phone No. & Preferred Pharmacy:  [phone] (home); [phone] (mobile); (Preferred) [phone] [email or web address]  [facility] [address], [place] - [address] [place] [address] [place] [address] Phone: [phone] Fax: [phone]  [id number] [place] - [address] AT [facility] [address] [facility] [place] [address] Phone: [phone] Fax: [phone]    Pre-screening note:  Our staff contacted Jay Sullivan and offered him an "in person", "face-to-face" appointment versus a telephone encounter. He indicated preferring the telephone encounter, at this time.   Reason for Virtual Visit: COVID-19*  Social distancing based on CDC and AMA recommendations.   I contacted Jay Sullivan on 08/25/2019 via telephone.      I clearly identified myself as [name], MD. I verified that I was speaking with the correct person using two identifiers (Name: Jay Sullivan, and date of birth: 08-22-1976).  Advanced Informed Consent I sought verbal advanced consent from Jay Sullivan for virtual visit interactions. I informed Jay Sullivan of possible security and privacy concerns, risks, and limitations associated with providing "not-in-person" medical evaluation and management services. I also informed Jay Sullivan of the availability of "in-person" appointments. Finally, I informed him that there would be a charge for the virtual visit and that he could be  personally, fully or partially, financially responsible for it. Jay Sullivan expressed understanding and agreed to proceed.   Historic Elements   Jay Sullivan is a 43 y.o. year old, male patient evaluated today after his last encounter by our practice on 08/09/2019. Jay Sullivan  has a past medical history of Allergy  and Arthritis. He also  has a past surgical history that includes Cardiac surgery; Joint replacement; carapal; 5 shoulder surgery; back surgery; and Ankle surgery. Jay Sullivan has a current medication list which includes the following prescription(s): aspirin ec, clonazepam, empagliflozin, ibuprofen, lisinopril, methocarbamol, mirtazapine, zolpidem, metformin, and metronidazole. He  reports that he has never smoked. His smokeless tobacco use includes chew. He reports that he does not drink alcohol or use drugs. Jay Sullivan is allergic to amoxicillin-pot clavulanate; gabapentin; and oxycodone-acetaminophen.   HPI  Today, he is being contacted for a post-procedure assessment.  Virtual follow-up for postprocedural evaluation status post right knee genicular nerve block performed on 08/04/2019.  Unfortunately this was not helpful in decreasing the patient's pain.  This resulted in knee swelling which got better after the procedure.  However the procedure did not impact his pain even for a couple of days.  This is unfortunate.  In regards to medication management, I expressed my concerns about the patient's urine toxicology screen below.  He states that he did have hydrocodone remaining from a previous surgery that he took a day before his appointment.  He states that he had it from his previous shoulder surgery.  I will have the patient complete a urine toxicology screen.    Furthermore, the patient has an appointment that is upcoming with psychiatry for risk assessment regarding substance use disorder.  UDS:  Summary  Date Value Ref Range Status  07/06/2019 Note  Final    Comment:    ==================================================================== Compliance Drug Analysis, Ur ==================================================================== Test  Result       Flag       Units Drug Present and Declared for Prescription Verification   Zolpidem                        PRESENT      EXPECTED   Zolpidem Acid                  PRESENT      EXPECTED    Zolpidem acid is an expected metabolite of zolpidem.   Mirtazapine                    PRESENT      EXPECTED Drug Present not Declared for Prescription Verification   Hydrocodone                    2118         UNEXPECTED ng/mg creat   Hydromorphone                  645          UNEXPECTED ng/mg creat   Dihydrocodeine                 229          UNEXPECTED ng/mg creat   Norhydrocodone                 2313         UNEXPECTED ng/mg creat    Sources of hydrocodone include scheduled prescription medications.    Hydromorphone, dihydrocodeine and norhydrocodone are expected    metabolites of hydrocodone. Hydromorphone and dihydrocodeine are    also available as scheduled prescription medications.   Acetaminophen                  PRESENT      UNEXPECTED   Naproxen                       PRESENT      UNEXPECTED Drug Absent but Declared for Prescription Verification   Clonazepam                     Not Detected UNEXPECTED ng/mg creat   Oxycodone                      Not Detected UNEXPECTED ng/mg creat   Methocarbamol                  Not Detected UNEXPECTED   Salicylate                     Not Detected UNEXPECTED    Aspirin, as indicated in the declared medication list, is not always    detected even when used as directed.   Ibuprofen                      Not Detected UNEXPECTED    Ibuprofen, as indicated in the declared medication list, is not    always detected even when used as directed. ==================================================================== Test                      Result    Flag   Units      Ref Range   Creatinine              130  mg/dL      >=44>=20 ==================================================================== Declared Medications:  The flagging and interpretation on this report are based on the  following declared medications.  Unexpected results may arise from  inaccuracies in  the declared medications.  **Note: The testing scope of this panel includes these medications:  Clonazepam  Methocarbamol (Robaxin)  Mirtazapine (Remeron)  Oxycodone  **Note: The testing scope of this panel does not include small to  moderate amounts of these reported medications:  Aspirin  Ibuprofen  Zolpidem  **Note: The testing scope of this panel does not include the  following reported medications:  Empagliflozin  Lisinopril  Metformin  Metronidazole (Flagyl)  Ondansetron ==================================================================== For clinical consultation, please call [phone]. ====================================================================    Laboratory Chemistry Profile (12 mo)  Renal: 05/12/2019: BUN 9; Creatinine, Ser 0.83  Lab Results  Component Value Date   GFRAA >60 05/12/2019   GFRNONAA >60 05/12/2019   Hepatic: 04/01/2019: Albumin 4.4 Lab Results  Component Value Date   AST 31 04/01/2019   ALT 41 04/01/2019   Other: 05/12/2019: Sed Rate 9 Note: Above Lab results reviewed.   Assessment  The primary encounter diagnosis was Chronic pain syndrome. Diagnoses of Primary osteoarthritis of right knee, Lumbar facet arthropathy, Lumbar spondylosis, Lumbar degenerative disc disease, and History of lumbar fusion were also pertinent to this visit.  Plan of Care  I have discontinued Jay Sullivan ondansetron and oxyCODONE. I am also having him maintain his ibuprofen, empagliflozin, lisinopril, metFORMIN, metroNIDAZOLE, mirtazapine, clonazePAM, aspirin EC, zolpidem, and methocarbamol.  Orders:  Orders Placed This Encounter  Procedures  . Compliance Drug Analysis, Ur    Volume: 30 ml(s). Minimum 3 ml of urine is needed. Document temperature of fresh sample. Indications: Long term (current) use of opiate analgesic (L87.564(Z79.891) Test#: [id number] (Comprehensive Profile)   Follow-up plan:   Return for Patient will call after Psychological evaluation.      Status post right knee genicular nerve block diagnostic on 08/04/2019: Not effective    Recent Visits Date Type Provider Dept  08/04/19 Procedure visit Edward JollyLateef, Jaliel Deavers, MD [facility]  07/06/19 Office Visit Edward JollyLateef, Pariss Hommes, MD [facility]  Showing recent visits within past 90 days and meeting all other requirements   Today's Visits Date Type Provider Dept  08/25/19 Office Visit Edward JollyLateef, Tyshawna Alarid, MD [facility]  Showing today's visits and meeting all other requirements   Future Appointments No visits were found meeting these conditions.  Showing future appointments within next 90 days and meeting all other requirements   I discussed the assessment and treatment plan with the patient. The patient was provided an opportunity to ask questions and all were answered. The patient agreed with the plan and demonstrated an understanding of the instructions.  Patient advised to call back or seek an in-person evaluation if the symptoms or condition worsens.  Total duration of non-face-to-face encounter: 21 minutes.  Note by: Edward JollyBilal Nicki Gracy, MD Date: 08/25/2019; Time: 4:28 PM  Note: This dictation was prepared with Dragon dictation. Any transcriptional errors that may result from this process are unintentional.  Disclaimer:  * Given the special circumstances of the COVID-19 pandemic, the federal government has announced that the Office for Civil Rights (OCR) will exercise its enforcement discretion and will not impose penalties on physicians using telehealth in the event of noncompliance with regulatory requirements under the [employer] Insurance Portability and Accountability Act (HIPAA) in connection with the good faith provision of telehealth during the COVID-19 national public health emergency. (AMA)

## 2019-09-01 ENCOUNTER — Ambulatory Visit (INDEPENDENT_AMBULATORY_CARE_PROVIDER_SITE_OTHER): Payer: BC Managed Care – PPO | Admitting: Psychiatry

## 2019-09-01 ENCOUNTER — Encounter: Payer: Self-pay | Admitting: Psychiatry

## 2019-09-01 ENCOUNTER — Other Ambulatory Visit: Payer: Self-pay

## 2019-09-01 DIAGNOSIS — S161XXA Strain of muscle, fascia and tendon at neck level, initial encounter: Secondary | ICD-10-CM | POA: Insufficient documentation

## 2019-09-01 DIAGNOSIS — G894 Chronic pain syndrome: Secondary | ICD-10-CM | POA: Diagnosis not present

## 2019-09-01 DIAGNOSIS — S40019A Contusion of unspecified shoulder, initial encounter: Secondary | ICD-10-CM | POA: Insufficient documentation
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
## 2019-09-07 LAB — COMPLIANCE DRUG ANALYSIS, UR

## 2019-09-08 ENCOUNTER — Ambulatory Visit
Payer: BC Managed Care – PPO | Attending: Student in an Organized Health Care Education/Training Program | Admitting: Student in an Organized Health Care Education/Training Program

## 2019-09-08 ENCOUNTER — Telehealth: Payer: Self-pay

## 2019-09-08 ENCOUNTER — Other Ambulatory Visit: Payer: Self-pay

## 2019-09-08 ENCOUNTER — Encounter: Payer: Self-pay | Admitting: Student in an Organized Health Care Education/Training Program

## 2019-09-08 DIAGNOSIS — M1711 Unilateral primary osteoarthritis, right knee: Secondary | ICD-10-CM | POA: Diagnosis not present

## 2019-09-08 DIAGNOSIS — M4306 Spondylolysis, lumbar region: Secondary | ICD-10-CM

## 2019-09-08 DIAGNOSIS — M47816 Spondylosis without myelopathy or radiculopathy, lumbar region: Secondary | ICD-10-CM

## 2019-09-08 DIAGNOSIS — G894 Chronic pain syndrome: Secondary | ICD-10-CM | POA: Diagnosis not present

## 2019-09-08 DIAGNOSIS — Z981 Arthrodesis status: Secondary | ICD-10-CM | POA: Diagnosis not present

## 2019-09-08 DIAGNOSIS — M5136 Other intervertebral disc degeneration, lumbar region: Secondary | ICD-10-CM

## 2019-09-08 MED ORDER — BELBUCA 300 MCG BU FILM: 300.0000 ug | ORAL_FILM | Freq: Two times a day (BID) | BUCCAL | 0 refills | Status: DC

## 2019-09-08 NOTE — Progress Notes (Signed)
Pain Management Virtual Encounter Note - Virtual Visit via Video Conference Telehealth (real-time audio visits between healthcare provider and patient).   Patient's Phone No. & Preferred Pharmacy:  [phone] (home); [phone] (mobile); (Preferred) [phone] [email or web address]  [id number] Dan Humphreys, [place] - [address] AT [facility] OF [address] [place] [address] Phone: [phone] Fax: [phone]    Pre-screening note:  Our staff contacted [name] and offered him an "in person", "face-to-face" appointment versus a telephone encounter. He indicated preferring the telephone encounter, at this time.   Reason for Virtual Visit: COVID-19*  Social distancing based on CDC and AMA recommendations.   I contacted Jay Sullivan on 09/08/2019 via video conference.      I clearly identified myself as [name], MD. I verified that I was speaking with the correct person using two identifiers (Name: Jay Sullivan, and date of birth: 09-06-1976).  Advanced Informed Consent I sought verbal advanced consent from Jay Sullivan for virtual visit interactions. I informed [name] of possible security and privacy concerns, risks, and limitations associated with providing "not-in-person" medical evaluation and management services. I also informed [name] of the availability of "in-person" appointments. Finally, I informed him that there would be a charge for the virtual visit and that he could be  personally, fully or partially, financially responsible for it. [name] expressed understanding and agreed to proceed.   Historic Elements   [name] is a 43 y.o. year old, male patient evaluated today after his last encounter by our practice on 08/25/2019. [name]  has a past medical history of Allergy, Anxiety, Arthritis, Chronic pain, and Diabetes mellitus type I (HCC). He also  has a past surgical history that includes Cardiac  surgery; Joint replacement; carapal; 5 shoulder surgery; back surgery; and Ankle surgery. [name] has a current medication list which includes the following prescription(s): aspirin ec, empagliflozin, lisinopril, mirtazapine, zolpidem, and belbuca. He  reports that he has never smoked. His smokeless tobacco use includes chew. He reports that he does not drink alcohol or use drugs. [name] is allergic to amoxicillin-pot clavulanate; gabapentin; and oxycodone-acetaminophen.   HPI  Today, he is being contacted for medication management.   Repeat UDS appropriate.  Psychiatric assessment done.  Patient has tried oxycodone, hydrocodone, Percocet, tramadol in the past which only provided short-term pain relief.  Patient is in chronic pain requires long-acting pharmacologic's.  We discussed buprenorphine as this is safer, has less side effect profile, is less habit-forming and can provide long-acting pain relief.  Risks and benefits reviewed and patient would like to trial.  Pharmacotherapy Assessment  Analgesic: Start Belbuca 300 mcg twice daily Monitoring: Pharmacotherapy: No side-effects or adverse reactions reported. [place] PMP: PDMP reviewed during this encounter.       Compliance: No problems identified. Effectiveness: Clinically acceptable. Plan: Refer to "POC".  UDS:  Summary  Date Value Ref Range Status  09/06/2019 Note  Final    Comment:    ==================================================================== Compliance Drug Analysis, Ur ==================================================================== Test                             Result       Flag       Units Drug Present and Declared for Prescription Verification   Zolpidem Acid  PRESENT      EXPECTED    Zolpidem acid is an expected metabolite of zolpidem.   Mirtazapine                    PRESENT      EXPECTED Drug Present not Declared for Prescription Verification   Diphenhydramine                PRESENT       UNEXPECTED Drug Absent but Declared for Prescription Verification   Salicylate                     Not Detected UNEXPECTED    Aspirin, as indicated in the declared medication list, is not always    detected even when used as directed. ==================================================================== Test                      Result    Flag   Units      Ref Range   Creatinine              84               mg/dL      >=20 ==================================================================== Declared Medications:  The flagging and interpretation on this report are based on the  following declared medications.  Unexpected results may arise from  inaccuracies in the declared medications.  **Note: The testing scope of this panel includes these medications:  Mirtazapine  **Note: The testing scope of this panel does not include small to  moderate amounts of these reported medications:  Aspirin  Zolpidem  **Note: The testing scope of this panel does not include the  following reported medications:  Empagliflozin  Lisinopril ==================================================================== For clinical consultation, please call [phone]. ====================================================================    Laboratory Chemistry Profile (12 mo)  Renal: 05/12/2019: BUN 9; Creatinine, Ser 0.83  Lab Results  Component Value Date   GFRAA >60 05/12/2019   GFRNONAA >60 05/12/2019   Hepatic: 04/01/2019: Albumin 4.4 Lab Results  Component Value Date   AST 31 04/01/2019   ALT 41 04/01/2019   Other: 05/12/2019: Sed Rate 9 Note: Above Lab results reviewed.    Assessment  The primary encounter diagnosis was Chronic pain syndrome. Diagnoses of Primary osteoarthritis of right knee, Lumbar facet arthropathy, History of lumbar fusion, Pars defect of lumbar spine, Lumbar spondylosis, and Lumbar degenerative disc disease were also pertinent to this visit.  Plan of Care  I am having  [name] start on [place]. I am also having him maintain his empagliflozin, lisinopril, mirtazapine, aspirin EC, and zolpidem.  Pharmacotherapy (Medications Ordered): Meds ordered this encounter  Medications  . Buprenorphine HCl (BELBUCA) 300 MCG FILM    Sig: Place 300 mcg inside cheek every 12 (twelve) hours.    Dispense:  60 each    Refill:  0   Orders:  No orders of the defined types were placed in this encounter.  Follow-up plan:   Return in about 4 weeks (around 10/06/2019) for Medication Management, virtual.     Status post right knee genicular nerve block diagnostic on 08/04/2019: Not effective     Recent Visits Date Type Provider Dept  08/25/19 Office Visit Gillis Santa, MD [facility]  08/04/19 Procedure visit Gillis Santa, MD [facility]  07/06/19 Office Visit Gillis Santa, MD [facility]  Showing recent visits within past 90 days and meeting all other requirements   Today's Visits Date Type  Provider Dept  09/08/19 Office Visit Edward JollyLateef, Maxden Naji, MD [facility]  Showing today's visits and meeting all other requirements   Future Appointments No visits were found meeting these conditions.  Showing future appointments within next 90 days and meeting all other requirements   I discussed the assessment and treatment plan with the patient. The patient was provided an opportunity to ask questions and all were answered. The patient agreed with the plan and demonstrated an understanding of the instructions.  Patient advised to call back or seek an in-person evaluation if the symptoms or condition worsens.  Total duration of non-face-to-face encounter: 15 minutes.  Note by: Edward JollyBilal Renate Danh, MD Date: 09/08/2019; Time: 2:50 PM  Note: This dictation was prepared with Dragon dictation. Any transcriptional errors that may result from this process are unintentional.  Disclaimer:  * Given the special circumstances of the COVID-19 pandemic,  the federal government has announced that the Office for Civil Rights (OCR) will exercise its enforcement discretion and will not impose penalties on physicians using telehealth in the event of noncompliance with regulatory requirements under the [employer] Insurance Portability and Accountability Act (HIPAA) in connection with the good faith provision of telehealth during the COVID-19 national public health emergency. (AMA)

## 2019-09-08 NOTE — Telephone Encounter (Signed)
He said Dr. Holley Raring told him to call back if his insurance wouldn't cover his meds. He said they would have to have prior authorization.

## 2019-09-09 ENCOUNTER — Telehealth: Payer: Self-pay

## 2019-09-09 NOTE — Telephone Encounter (Signed)
He said he didn't hear anything from anyone yesterday, so he called his insurance company and they said they will not cover that drug. I told him I'm not sure why they would have sent the prior auth request if they wouldn't cover it. Please call him and update him on this.

## 2019-09-09 NOTE — Telephone Encounter (Signed)
Called pharmacy. They sent the PA to the wrong fax [facility]ber. States will send ASAP. Patient called and informed. Will do PA today.

## 2019-09-09 NOTE — Telephone Encounter (Signed)
This PA was done on 09/08/19 per Cyndi.

## 2019-09-09 NOTE — Telephone Encounter (Signed)
Please see phone call from 11/12 for updates on this.

## 2019-09-13 ENCOUNTER — Telehealth: Payer: Self-pay | Admitting: *Deleted

## 2019-09-13 DIAGNOSIS — G894 Chronic pain syndrome: Secondary | ICD-10-CM

## 2019-09-13 MED ORDER — BUPRENORPHINE 10 MCG/HR TD PTWK: 1.0000 | MEDICATED_PATCH | TRANSDERMAL | 2 refills | Status: DC

## 2019-09-13 NOTE — Telephone Encounter (Signed)
He called back stating that [place] told him they have denied the request for PA. He said Dr. Holley Raring told him to let him know if he had trouble getting it and he would prescribe something else. Please call him and let him know what he can do.

## 2019-09-13 NOTE — Telephone Encounter (Signed)
Spoke with patient to let him know that Butrans patches have been sent in for him. He will have them try and run this and see if we need a PA,

## 2019-09-13 NOTE — Addendum Note (Signed)
Addended by: Gillis Santa on: 09/13/2019 01:52 PM   Modules accepted: Orders

## 2019-09-13 NOTE — Telephone Encounter (Signed)
Spoke with patient and he has was notified that the [facility] had been denied. I also looked up on CMM and in fact the PA request has received an unfavorable outcome.  I told him I would send message to Dr Holley Raring and see what he would like to do.

## 2019-09-13 NOTE — Addendum Note (Signed)
Addended by: Gillis Santa on: 09/13/2019 12:35 PM   Modules accepted: Orders

## 2019-09-14 ENCOUNTER — Telehealth: Payer: Self-pay | Admitting: Student in an Organized Health Care Education/Training Program

## 2019-09-14 NOTE — Telephone Encounter (Signed)
Pt called and said that the butrans patch is being denied as well and pt would like to know what he can do.

## 2019-09-15 ENCOUNTER — Telehealth: Payer: Self-pay | Admitting: *Deleted

## 2019-09-15 NOTE — Telephone Encounter (Signed)
noted 

## 2019-09-20 ENCOUNTER — Telehealth: Payer: Self-pay | Admitting: Student in an Organized Health Care Education/Training Program

## 2019-09-20 NOTE — Telephone Encounter (Signed)
Patient lvmail at 4:15 am this morning stating he has had a severe headache with nausea and vomiting since he started his meds. Please call asap

## 2019-09-20 NOTE — Telephone Encounter (Signed)
Changed this patient to Tue 09-21-19 at 10am

## 2019-09-20 NOTE — Telephone Encounter (Signed)
Patient states he has had a headache since the day after he started the BUTRAN's patch. States it got so bad over the weekend that he had some vomiting as well. Went to [facility] this morning and stated the doctor there said it could be a side effect of the medication. Patient has since taken the patch off and discontinued the medication. Would like to move his 10/06/19 medication management follow up appointment up to ASAP to speak with Dr. Holley Raring about his medication. Please schedule and call patient with appointment. Thank you- Anderson Malta

## 2019-09-21 ENCOUNTER — Other Ambulatory Visit: Payer: Self-pay

## 2019-09-21 ENCOUNTER — Encounter: Payer: Self-pay | Admitting: Student in an Organized Health Care Education/Training Program

## 2019-09-21 ENCOUNTER — Ambulatory Visit
Payer: BC Managed Care – PPO | Attending: Student in an Organized Health Care Education/Training Program | Admitting: Student in an Organized Health Care Education/Training Program

## 2019-09-21 DIAGNOSIS — M1711 Unilateral primary osteoarthritis, right knee: Secondary | ICD-10-CM | POA: Diagnosis not present

## 2019-09-21 DIAGNOSIS — Z981 Arthrodesis status: Secondary | ICD-10-CM

## 2019-09-21 DIAGNOSIS — M4306 Spondylolysis, lumbar region: Secondary | ICD-10-CM

## 2019-09-21 DIAGNOSIS — M5136 Other intervertebral disc degeneration, lumbar region: Secondary | ICD-10-CM

## 2019-09-21 DIAGNOSIS — G894 Chronic pain syndrome: Secondary | ICD-10-CM

## 2019-09-21 DIAGNOSIS — M47816 Spondylosis without myelopathy or radiculopathy, lumbar region: Secondary | ICD-10-CM

## 2019-09-21 MED ORDER — TIZANIDINE HCL 4 MG PO TABS: 4.0000 mg | ORAL_TABLET | Freq: Three times a day (TID) | ORAL | 1 refills | Status: AC | PRN
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
## 2019-10-06 ENCOUNTER — Encounter: Payer: BC Managed Care – PPO | Admitting: Student in an Organized Health Care Education/Training Program

## 2019-11-02 ENCOUNTER — Ambulatory Visit: Payer: Self-pay | Admitting: Urology

## 2019-12-07 ENCOUNTER — Ambulatory Visit: Payer: Self-pay | Admitting: Urology

## 2020-02-07 ENCOUNTER — Inpatient Hospital Stay
Admission: EM | Admit: 2020-02-07 | Discharge: 2020-02-09 | DRG: 871 | Disposition: A | Discharge status: Home / Self Care | Payer: BC Managed Care – PPO | Attending: Internal Medicine | Admitting: Internal Medicine

## 2020-02-07 ENCOUNTER — Other Ambulatory Visit: Payer: Self-pay

## 2020-02-07 ENCOUNTER — Emergency Department: Payer: BC Managed Care – PPO

## 2020-02-07 DIAGNOSIS — Z823 Family history of stroke: Secondary | ICD-10-CM | POA: Diagnosis not present

## 2020-02-07 DIAGNOSIS — Z885 Allergy status to narcotic agent status: Secondary | ICD-10-CM | POA: Diagnosis not present

## 2020-02-07 DIAGNOSIS — Z880 Allergy status to penicillin: Secondary | ICD-10-CM

## 2020-02-07 DIAGNOSIS — A4189 Other specified sepsis: Secondary | ICD-10-CM | POA: Diagnosis present

## 2020-02-07 DIAGNOSIS — Z79899 Other long term (current) drug therapy: Secondary | ICD-10-CM | POA: Diagnosis not present

## 2020-02-07 DIAGNOSIS — J1282 Pneumonia due to coronavirus disease 2019: Secondary | ICD-10-CM | POA: Diagnosis present

## 2020-02-07 DIAGNOSIS — R509 Fever, unspecified: Secondary | ICD-10-CM | POA: Diagnosis not present

## 2020-02-07 DIAGNOSIS — R05 Cough: Secondary | ICD-10-CM

## 2020-02-07 DIAGNOSIS — Z7982 Long term (current) use of aspirin: Secondary | ICD-10-CM

## 2020-02-07 DIAGNOSIS — G8929 Other chronic pain: Secondary | ICD-10-CM | POA: Diagnosis present

## 2020-02-07 DIAGNOSIS — F419 Anxiety disorder, unspecified: Secondary | ICD-10-CM | POA: Diagnosis present

## 2020-02-07 DIAGNOSIS — R079 Chest pain, unspecified: Secondary | ICD-10-CM

## 2020-02-07 DIAGNOSIS — Z8249 Family history of ischemic heart disease and other diseases of the circulatory system: Secondary | ICD-10-CM | POA: Diagnosis not present

## 2020-02-07 DIAGNOSIS — E871 Hypo-osmolality and hyponatremia: Secondary | ICD-10-CM | POA: Diagnosis present

## 2020-02-07 DIAGNOSIS — F17220 Nicotine dependence, chewing tobacco, uncomplicated: Secondary | ICD-10-CM | POA: Diagnosis present

## 2020-02-07 DIAGNOSIS — Z794 Long term (current) use of insulin: Secondary | ICD-10-CM

## 2020-02-07 DIAGNOSIS — J45909 Unspecified asthma, uncomplicated: Secondary | ICD-10-CM | POA: Diagnosis present

## 2020-02-07 DIAGNOSIS — R059 Cough, unspecified: Secondary | ICD-10-CM

## 2020-02-07 DIAGNOSIS — R001 Bradycardia, unspecified: Secondary | ICD-10-CM | POA: Diagnosis not present

## 2020-02-07 DIAGNOSIS — E109 Type 1 diabetes mellitus without complications: Secondary | ICD-10-CM

## 2020-02-07 DIAGNOSIS — Z888 Allergy status to other drugs, medicaments and biological substances status: Secondary | ICD-10-CM | POA: Diagnosis not present

## 2020-02-07 DIAGNOSIS — I10 Essential (primary) hypertension: Secondary | ICD-10-CM | POA: Diagnosis present

## 2020-02-07 DIAGNOSIS — U071 COVID-19: Secondary | ICD-10-CM | POA: Diagnosis present

## 2020-02-07 LAB — CBC WITH DIFFERENTIAL/PLATELET
Abs Immature Granulocytes: 0.04 10*3/uL (ref 0.00–0.07)
Basophils Absolute: 0 10*3/uL (ref 0.0–0.1)
Basophils Relative: 0 %
Eosinophils Absolute: 0 10*3/uL (ref 0.0–0.5)
Eosinophils Relative: 0 %
HCT: 46.7 % (ref 39.0–52.0)
Hemoglobin: 16.1 g/dL (ref 13.0–17.0)
Immature Granulocytes: 0 %
Lymphocytes Relative: 41 %
Lymphs Abs: 3.7 10*3/uL (ref 0.7–4.0)
MCH: 29.2 pg (ref 26.0–34.0)
MCHC: 34.5 g/dL (ref 30.0–36.0)
MCV: 84.8 fL (ref 80.0–100.0)
Monocytes Absolute: 0.4 10*3/uL (ref 0.1–1.0)
Monocytes Relative: 5 %
Neutro Abs: 4.9 10*3/uL (ref 1.7–7.7)
Neutrophils Relative %: 54 %
Platelets: 167 10*3/uL (ref 150–400)
RBC: 5.51 MIL/uL (ref 4.22–5.81)
RDW: 13.2 % (ref 11.5–15.5)
WBC: 9.1 10*3/uL (ref 4.0–10.5)
nRBC: 0 % (ref 0.0–0.2)

## 2020-02-07 LAB — COMPREHENSIVE METABOLIC PANEL
ALT: 27 U/L (ref 0–44)
AST: 28 U/L (ref 15–41)
Albumin: 3.7 g/dL (ref 3.5–5.0)
Alkaline Phosphatase: 51 U/L (ref 38–126)
Anion gap: 10 (ref 5–15)
BUN: 12 mg/dL (ref 6–20)
CO2: 25 mmol/L (ref 22–32)
Calcium: 8.5 mg/dL — ABNORMAL LOW (ref 8.9–10.3)
Chloride: 98 mmol/L (ref 98–111)
Creatinine, Ser: 0.83 mg/dL (ref 0.61–1.24)
GFR calc Af Amer: >60 mL/min (ref >60)
GFR calc non Af Amer: >60 mL/min (ref >60)
Glucose, Bld: 138 mg/dL — ABNORMAL HIGH (ref 70–99)
Potassium: 4.4 mmol/L (ref 3.5–5.1)
Sodium: 133 mmol/L — ABNORMAL LOW (ref 135–145)
Total Bilirubin: 0.7 mg/dL (ref 0.3–1.2)
Total Protein: 7.6 g/dL (ref 6.5–8.1)

## 2020-02-07 LAB — POC SARS CORONAVIRUS 2 AG: SARS Coronavirus 2 Ag: POSITIVE — AB

## 2020-02-07 LAB — FIBRINOGEN: Fibrinogen: 642 mg/dL — ABNORMAL HIGH (ref 210–475)

## 2020-02-07 LAB — FERRITIN: Ferritin: 366 ng/mL — ABNORMAL HIGH (ref 24–336)

## 2020-02-07 LAB — TROPONIN I (HIGH SENSITIVITY)
Troponin I (High Sensitivity): 6 ng/L (ref <18)
Troponin I (High Sensitivity): 6 ng/L (ref <18)

## 2020-02-07 LAB — GLUCOSE, CAPILLARY
Glucose-Capillary: 229 mg/dL — ABNORMAL HIGH (ref 70–99)
Glucose-Capillary: 183 mg/dL — ABNORMAL HIGH (ref 70–99)
Glucose-Capillary: 199 mg/dL — ABNORMAL HIGH (ref 70–99)
Glucose-Capillary: 182 mg/dL — ABNORMAL HIGH (ref 70–99)

## 2020-02-07 LAB — LACTIC ACID, PLASMA: Lactic Acid, Venous: 1 mmol/L (ref 0.5–1.9)

## 2020-02-07 LAB — FIBRIN DERIVATIVES D-DIMER (ARMC ONLY): Fibrin derivatives D-dimer (ARMC): 526.21 ng/mL (FEU) — ABNORMAL HIGH (ref 0.00–499.00)

## 2020-02-07 LAB — HIV ANTIBODY (ROUTINE TESTING W REFLEX): HIV Screen 4th Generation wRfx: NONREACTIVE

## 2020-02-07 LAB — BRAIN NATRIURETIC PEPTIDE: B Natriuretic Peptide: 29 pg/mL (ref 0.0–100.0)

## 2020-02-07 LAB — PROCALCITONIN: Procalcitonin: <0.1 ng/mL

## 2020-02-07 LAB — LACTATE DEHYDROGENASE: LDH: 246 U/L — ABNORMAL HIGH (ref 98–192)

## 2020-02-07 LAB — ABO/RH: ABO/RH(D): A POS

## 2020-02-07 LAB — C-REACTIVE PROTEIN: CRP: 11.1 mg/dL — ABNORMAL HIGH (ref <1.0)

## 2020-02-07 MED ORDER — MAGNESIUM HYDROXIDE 400 MG/5ML PO SUSP
30.0000 mL | Freq: Every day | ORAL | Status: DC | PRN
  Filled 2020-02-07: qty 30

## 2020-02-07 MED ORDER — TRAZODONE HCL 50 MG PO TABS: 25.0000 mg | ORAL_TABLET | Freq: Every evening | ORAL | Status: DC | PRN

## 2020-02-07 MED ORDER — HYDROCOD POLST-CPM POLST ER 10-8 MG/5ML PO SUER
5.0000 mL | Freq: Once | ORAL | Status: AC
  Administered 2020-02-07: 5 mL via ORAL
  Filled 2020-02-07: qty 5

## 2020-02-07 MED ORDER — ONDANSETRON HCL 4 MG PO TABS: 4.0000 mg | ORAL_TABLET | Freq: Four times a day (QID) | ORAL | Status: DC | PRN

## 2020-02-07 MED ORDER — ACETAMINOPHEN 500 MG PO TABS
1000.0000 mg | ORAL_TABLET | Freq: Once | ORAL | Status: AC
  Administered 2020-02-07: 04:00:00 1000 mg via ORAL
  Filled 2020-02-07: qty 2

## 2020-02-07 MED ORDER — VITAMIN D 25 MCG (1000 UNIT) PO TABS
1000.0000 [IU] | ORAL_TABLET | Freq: Every day | ORAL | Status: DC
  Administered 2020-02-07 – 2020-02-09 (×3): 1000 [IU] via ORAL
  Filled 2020-02-07 (×3): qty 1

## 2020-02-07 MED ORDER — SODIUM CHLORIDE 0.9 % IV SOLN: 100.0000 mg | Freq: Every day | INTRAVENOUS | Status: DC

## 2020-02-07 MED ORDER — ZINC SULFATE 220 (50 ZN) MG PO CAPS
220.0000 mg | ORAL_CAPSULE | Freq: Every day | ORAL | Status: DC
  Administered 2020-02-07 – 2020-02-09 (×3): 220 mg via ORAL
  Filled 2020-02-07 (×3): qty 1

## 2020-02-07 MED ORDER — ASPIRIN EC 81 MG PO TBEC
81.0000 mg | DELAYED_RELEASE_TABLET | Freq: Every day | ORAL | Status: DC
  Administered 2020-02-07 – 2020-02-09 (×3): 81 mg via ORAL
  Filled 2020-02-07 (×3): qty 1

## 2020-02-07 MED ORDER — ASCORBIC ACID 500 MG PO TABS
500.0000 mg | ORAL_TABLET | Freq: Every day | ORAL | Status: DC
  Administered 2020-02-07 – 2020-02-09 (×3): 500 mg via ORAL
  Filled 2020-02-07 (×3): qty 1

## 2020-02-07 MED ORDER — DEXAMETHASONE SODIUM PHOSPHATE 10 MG/ML IJ SOLN: 6.0000 mg | INTRAMUSCULAR | Status: DC

## 2020-02-07 MED ORDER — SODIUM CHLORIDE 0.9 % IV SOLN: 200.0000 mg | Freq: Once | INTRAVENOUS | Status: DC

## 2020-02-07 MED ORDER — ONDANSETRON HCL 4 MG/2ML IJ SOLN: 4.0000 mg | Freq: Four times a day (QID) | INTRAMUSCULAR | Status: DC | PRN

## 2020-02-07 MED ORDER — ENOXAPARIN SODIUM 40 MG/0.4ML ~~LOC~~ SOLN
40.0000 mg | SUBCUTANEOUS | Status: DC
  Administered 2020-02-07 – 2020-02-09 (×3): 40 mg via SUBCUTANEOUS
  Filled 2020-02-07 (×3): qty 0.4

## 2020-02-07 MED ORDER — GUAIFENESIN-DM 100-10 MG/5ML PO SYRP
10.0000 mL | ORAL_SOLUTION | ORAL | Status: DC | PRN
  Administered 2020-02-08: 17:00:00 10 mL via ORAL
  Filled 2020-02-07 (×2): qty 10

## 2020-02-07 MED ORDER — HYDROCOD POLST-CPM POLST ER 10-8 MG/5ML PO SUER
5.0000 mL | Freq: Two times a day (BID) | ORAL | Status: DC | PRN
  Administered 2020-02-07 – 2020-02-08 (×3): 5 mL via ORAL
  Filled 2020-02-07 (×6): qty 5

## 2020-02-07 MED ORDER — INSULIN ASPART 100 UNIT/ML ~~LOC~~ SOLN
0.0000 [IU] | Freq: Three times a day (TID) | SUBCUTANEOUS | Status: DC
  Administered 2020-02-07 – 2020-02-08 (×4): 4 [IU] via SUBCUTANEOUS
  Administered 2020-02-08: 7 [IU] via SUBCUTANEOUS
  Administered 2020-02-08: 22:00:00 3 [IU] via SUBCUTANEOUS
  Administered 2020-02-08: 17:00:00 4 [IU] via SUBCUTANEOUS
  Filled 2020-02-07 (×6): qty 1

## 2020-02-07 MED ORDER — FAMOTIDINE 20 MG PO TABS
20.0000 mg | ORAL_TABLET | Freq: Two times a day (BID) | ORAL | Status: DC
  Administered 2020-02-07 – 2020-02-09 (×5): 20 mg via ORAL
  Filled 2020-02-07 (×5): qty 1

## 2020-02-07 MED ORDER — EMPAGLIFLOZIN 25 MG PO TABS: 25.0000 mg | ORAL_TABLET | Freq: Every day | ORAL | Status: DC

## 2020-02-07 MED ORDER — LISINOPRIL 10 MG PO TABS
10.0000 mg | ORAL_TABLET | Freq: Every day | ORAL | Status: DC
  Administered 2020-02-07 – 2020-02-09 (×3): 10 mg via ORAL
  Filled 2020-02-07 (×3): qty 1

## 2020-02-07 MED ORDER — SODIUM CHLORIDE 0.9 % IV SOLN: INTRAVENOUS | Status: DC

## 2020-02-07 MED ORDER — MIRTAZAPINE 15 MG PO TABS
15.0000 mg | ORAL_TABLET | Freq: Every day | ORAL | Status: DC
  Administered 2020-02-07 – 2020-02-08 (×2): 15 mg via ORAL
  Filled 2020-02-07 (×2): qty 1

## 2020-02-07 MED ORDER — DEXAMETHASONE SODIUM PHOSPHATE 10 MG/ML IJ SOLN
6.0000 mg | INTRAMUSCULAR | Status: DC
  Administered 2020-02-07 – 2020-02-09 (×3): 6 mg via INTRAVENOUS
  Filled 2020-02-07 (×3): qty 1

## 2020-02-07 MED ORDER — GUAIFENESIN ER 600 MG PO TB12
600.0000 mg | ORAL_TABLET | Freq: Two times a day (BID) | ORAL | Status: DC
  Administered 2020-02-07 – 2020-02-09 (×5): 600 mg via ORAL
  Filled 2020-02-07 (×7): qty 1

## 2020-02-07 MED ORDER — ALBUTEROL SULFATE HFA 108 (90 BASE) MCG/ACT IN AERS
2.0000 | INHALATION_SPRAY | RESPIRATORY_TRACT | Status: DC | PRN
  Filled 2020-02-07: qty 6.7

## 2020-02-07 MED ORDER — SODIUM CHLORIDE 0.9 % IV SOLN
100.0000 mg | Freq: Every day | INTRAVENOUS | Status: DC
  Administered 2020-02-08 – 2020-02-09 (×2): 100 mg via INTRAVENOUS
  Filled 2020-02-07: qty 100
  Filled 2020-02-07: qty 20
  Filled 2020-02-07: qty 100

## 2020-02-07 MED ORDER — SODIUM CHLORIDE 0.9 % IV BOLUS
1000.0000 mL | Freq: Once | INTRAVENOUS | Status: AC
  Administered 2020-02-07: 04:00:00 1000 mL via INTRAVENOUS

## 2020-02-07 MED ORDER — ZOLPIDEM TARTRATE 5 MG PO TABS: 5.0000 mg | ORAL_TABLET | Freq: Every evening | ORAL | Status: DC | PRN

## 2020-02-07 MED ORDER — ACETAMINOPHEN 325 MG PO TABS
650.0000 mg | ORAL_TABLET | Freq: Four times a day (QID) | ORAL | Status: DC | PRN
  Administered 2020-02-08 (×2): 650 mg via ORAL
  Filled 2020-02-07 (×2): qty 2

## 2020-02-07 MED ORDER — SODIUM CHLORIDE 0.9 % IV SOLN
200.0000 mg | Freq: Once | INTRAVENOUS | Status: AC
  Administered 2020-02-07: 200 mg via INTRAVENOUS
  Filled 2020-02-07 (×2): qty 40

## 2020-02-07 NOTE — Plan of Care (Signed)
  Problem: Education: Goal: Knowledge of [place] [facility] Education information/materials will improve Outcome: Progressing Goal: Emotional status will improve Outcome: Progressing Goal: Mental status will improve Outcome: Progressing Goal: Verbalization of understanding the information provided will improve Outcome: Progressing   

## 2020-02-07 NOTE — ED Triage Notes (Signed)
Reports tested COVID positive last Tuesday.  Reports fever that goes up and down, chest discomfort with cough and "feeling" bad.

## 2020-02-07 NOTE — Progress Notes (Signed)
Remdesivir - Pharmacy Brief Note   O:  CXR: IMPRESSION: 1. Patchy bilateral opacities consistent with bilateral pneumonia and history of COVID positivity. 2. Mild cardiomegaly SpO2: 94 - 95% on RA   A/P:  Remdesivir 200 mg IVPB once followed by 100 mg IVPB daily x 4 days.   Thomasene Ripple, PharmD, BCPS Clinical Pharmacist 02/07/2020 6:11 AM

## 2020-02-07 NOTE — ED Provider Notes (Signed)
[facility] Emergency Department Provider Note   ____________________________________________   First MD Initiated Contact with Patient 02/07/20 [phone]     (approximate)  I have reviewed the triage vital signs and the nursing notes.   HISTORY  Chief Complaint Fever    HPI Jay Sullivan is a 44 y.o. male who presents to the ED from home with a chief complaint of fever, cough and shortness of breath.  [facility] and tested positive for Covid on 4/7.  Reports fevers up to 104 F.  He was placed on Z-Pak for right ear infection; states his right ear has been bothering him for the past 2 months.  His PCP called in prednisone a couple of days after his positive diagnosis.  Does not have anything at home for cough.  Denies chest pain, abdominal pain, nausea, vomiting or diarrhea.       Past Medical History:  Diagnosis Date  . Allergy   . Anxiety   . Arthritis   . Chronic pain   . Diabetes mellitus type I [facility])     Patient Active Problem List   Diagnosis Date Noted  . Pneumonia due to COVID-19 virus 02/07/2020  . Contusion of shoulder region 09/01/2019  . Strain of neck muscle 09/01/2019  . Primary osteoarthritis of right knee 07/06/2019  . Lumbar facet arthropathy 07/06/2019  . Lumbar spondylosis 07/06/2019  . Lumbar degenerative disc disease 07/06/2019  . History of lumbar fusion 07/06/2019  . Pars defect of lumbar spine 07/06/2019  . Essential hypertension 05/21/2019  . Type 2 diabetes mellitus without complication, without long-term current use of insulin ([place]) 05/21/2019  . Morbid obesity with BMI of 40.0-44.9, adult ([place]) 04/08/2019  . Carpal tunnel syndrome, bilateral upper limbs 07/29/2016  . Rotator cuff tear, right 07/29/2016  . Depression 12/26/2014  . 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 PTSD (post-traumatic stress disorder) 11/24/2013  . Peptic ulcer disease  11/19/2013  . Chronic pain 11/03/2013  . Abdominal pain 10/19/2013    Past Surgical History:  Procedure Laterality Date  . 5 shoulder surgery    . ANKLE SURGERY    . back surgery     lumbar surgery  . carapal     bilateral  . 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 JOINT REPLACEMENT      Prior to Admission medications   Medication Sig Start Date End Date Taking? Authorizing Provider  aspirin EC 81 MG tablet Take by mouth.    [provider]  empagliflozin (JARDIANCE) 25 MG TABS tablet Take by mouth. 05/26/19 05/25/20  [provider]  lisinopril (ZESTRIL) 10 MG tablet Take by mouth. 06/02/19   [provider]  mirtazapine (REMERON) 15 MG tablet Take by mouth. 05/26/19 05/25/20  [provider]  zolpidem (AMBIEN) 5 MG tablet Take 10 mg by mouth at bedtime as needed for sleep.    [provider]    Allergies Amoxicillin-pot clavulanate, Gabapentin, and Oxycodone-acetaminophen  Family History  Problem Relation Age of Onset  . Heart attack Mother   . Heart disease Mother   . Stroke Father     Social History Social History   Tobacco Use  . Smoking status: Never Smoker  . Smokeless tobacco: Current User    Types: Chew  . Tobacco comment: started smokless tobacco at 44 years old  Substance Use Topics  . Alcohol use: No  .  Drug use: No    Review of Systems  Constitutional: Positive for fever/chills. Eyes: No visual changes. ENT: Positive for right ear pain.  No sore throat. Cardiovascular: Denies chest pain. Respiratory: Positive for cough and shortness of breath. Gastrointestinal: No abdominal pain.  No nausea, no vomiting.  No diarrhea.  No constipation. Genitourinary: Negative for dysuria. Musculoskeletal: Negative for back pain. Skin: Negative for rash. Neurological: Negative for headaches, focal weakness or numbness.   ____________________________________________   PHYSICAL EXAM:  VITAL SIGNS: ED Triage  Vitals  Enc Vitals Group     BP 02/07/20 0250 (!) 134/105     Pulse Rate 02/07/20 0250 (!) 113     Resp 02/07/20 0250 20     Temp 02/07/20 0250 (!) 100.7 F (38.2 C)     Temp Source 02/07/20 0250 Oral     SpO2 02/07/20 0250 95 %     Weight 02/07/20 0251 265 lb (120.2 kg)     Height 02/07/20 0251 5\' 10"  (1.778 m)     Head Circumference --      Peak Flow --      Pain Score 02/07/20 0250 9     Pain Loc --      Pain Edu? --      Excl. in GC? --     Constitutional: Alert and oriented. Well appearing and in mild acute distress. Eyes: Conjunctivae are normal. PERRL. EOMI. Head: Atraumatic. Ears: Right TM with mild fluid; otherwise unremarkable. Nose: No congestion/rhinnorhea. Mouth/Throat: Mucous membranes are moist.   Neck: No stridor.  Supple neck without meningismus. Cardiovascular: Tachycardic rate, regular rhythm. Grossly normal heart sounds.  Good peripheral circulation. Respiratory: Normal respiratory effort.  No retractions. Lungs CTAB. Gastrointestinal: Soft and nontender to light or deep palpation. No distention. No abdominal bruits. No CVA tenderness. Musculoskeletal: No lower extremity tenderness nor edema.  No joint effusions. Neurologic: Alert and oriented x3.  CN II-XII grossly intact.  Normal speech and language. No gross focal neurologic deficits are appreciated. No gait instability. Skin:  Skin is warm, dry and intact. No rash noted.  No petechiae. Psychiatric: Mood and affect are normal. Speech and behavior are normal.  ____________________________________________   LABS (all labs ordered are listed, but only abnormal results are displayed)  Labs Reviewed  COMPREHENSIVE METABOLIC PANEL - Abnormal; Notable for the following components:      Result Value   Sodium 133 (*)    Glucose, Bld 138 (*)    Calcium 8.5 (*)    All other components within normal limits  FIBRIN DERIVATIVES D-DIMER ([facility] ONLY) - Abnormal; Notable for the following components:   Fibrin  derivatives D-dimer ([facility]) 526.21 (*)    All other components within normal limits  FERRITIN - Abnormal; Notable for the following components:   Ferritin 366 (*)    All other components within normal limits  FIBRINOGEN - Abnormal; Notable for the following components:   Fibrinogen 642 (*)    All other components within normal limits  LACTATE DEHYDROGENASE - Abnormal; Notable for the following components:   LDH 246 (*)    All other components within normal limits  POC SARS CORONAVIRUS 2 AG - Abnormal; Notable for the following components:   SARS Coronavirus 2 Ag POSITIVE (*)    All other components within normal limits  CULTURE, BLOOD (ROUTINE X 2)  CULTURE, BLOOD (ROUTINE X 2)  CBC WITH DIFFERENTIAL/PLATELET  LACTIC ACID, PLASMA  BRAIN NATRIURETIC PEPTIDE  PROCALCITONIN  LACTIC ACID, PLASMA  HIV ANTIBODY (ROUTINE  TESTING W REFLEX)  C-REACTIVE PROTEIN  HEMOGLOBIN A1C  POC SARS CORONAVIRUS 2 AG -  ED  ABO/RH  ABO/RH  TROPONIN I (HIGH SENSITIVITY)  TROPONIN I (HIGH SENSITIVITY)   ____________________________________________  EKG  ED ECG REPORT I, Skyylar Kopf J, the attending physician, personally viewed and interpreted this ECG.   Date: 02/07/2020  EKG Time: 0258  Rate: 113  Rhythm: sinus tachycardia  Axis: Normal  Intervals:none  ST&T Change: Nonspecific  ____________________________________________  RADIOLOGY  ED MD interpretation: COVID-19 pneumonia   Official radiology report(s): DG Chest Port 1 View  Result Date: 02/07/2020 CLINICAL DATA:  Cough and chest pain EXAM: PORTABLE CHEST 1 VIEW COMPARISON:  05/12/2019 FINDINGS: Mild cardiomegaly. Patchy bilateral opacities compatible with pneumonia. No pleural effusion or pneumothorax. IMPRESSION: 1. Patchy bilateral opacities consistent with bilateral pneumonia and history of COVID positivity. 2. Mild cardiomegaly Electronically Signed   By: Jasmine Pang M.D.   On: 02/07/2020 03:53     ____________________________________________   PROCEDURES  Procedure(s) performed (including Critical Care):  .1-3 Lead EKG Interpretation Performed by: Irean Hong, MD Authorized by: Irean Hong, MD     Interpretation: abnormal     ECG rate:  110   ECG rate assessment: tachycardic     Rhythm: sinus tachycardia     Ectopy: none     Conduction: normal      CRITICAL CARE Performed by: Irean Hong   Total critical care time: 30 minutes  Critical care time was exclusive of separately billable procedures and treating other patients.  Critical care was necessary to treat or prevent imminent or life-threatening deterioration.  Critical care was time spent personally by me on the following activities: development of treatment plan with patient and/or surrogate as well as nursing, discussions with consultants, evaluation of patient's response to treatment, examination of patient, obtaining history from patient or surrogate, ordering and performing treatments and interventions, ordering and review of laboratory studies, ordering and review of radiographic studies, pulse oximetry and re-evaluation of patient's condition.  ____________________________________________   INITIAL IMPRESSION / ASSESSMENT AND PLAN / ED COURSE  As part of my medical decision making, I reviewed the following data within the electronic MEDICAL RECORD NUMBER Nursing notes reviewed and incorporated, Labs reviewed, EKG interpreted, Old chart reviewed, Radiograph reviewed and Notes from prior ED visits     Jay Sullivan was evaluated in Emergency Department on 02/07/2020 for the symptoms described in the history of present illness. He was evaluated in the context of the global COVID-19 pandemic, which necessitated consideration that the patient might be at risk for infection with the SARS-CoV-2 virus that causes COVID-19. Institutional protocols and algorithms that pertain to the evaluation of patients at risk  for COVID-19 are in a state of rapid change based on information released by regulatory bodies including the CDC and federal and state organizations. These policies and algorithms were followed during the patient's care in the ED.    44 year old male who tested positive for COVID-19 infection last week presenting with cough and shortness of breath Differential includes, but is not limited to, viral syndrome, bronchitis including COPD exacerbation, pneumonia, reactive airway disease including asthma, CHF including exacerbation with or without pulmonary/interstitial edema, pneumothorax, ACS, thoracic trauma, and pulmonary embolism.  Will obtain Covid panel, chest x-ray.  Tylenol for fever, Tussionex for cough. Will reassess.   Clinical Course as of Feb 06 553  Mon Feb 07, 2020  1607 Patient ambulated with pulse ox; was not hypoxic but very tachypneic.  Updated patient of all test results.  Will discuss with hospitalist services for admission.  Will initiate IV Decadron and Remdesivir.   [JS]    Clinical Course User Index [JS] Irean Hong, MD     ____________________________________________   FINAL CLINICAL IMPRESSION(S) / ED DIAGNOSES  Final diagnoses:  Fever, unspecified fever cause  Pneumonia due to COVID-19 virus     ED Discharge Orders    None       Note:  This document was prepared using Dragon voice recognition software and may include unintentional dictation errors.   Irean Hong, MD 02/07/20 [phone]

## 2020-02-07 NOTE — H&P (Signed)
[place] at [facility]   PATIENT NAME: Jay Sullivan    MR#:  [id number]  DATE OF BIRTH:  [birth year]  DATE OF ADMISSION:  02/07/2020  PRIMARY CARE PHYSICIAN: [facility]   REQUESTING/REFERRING PHYSICIAN: Chiquita Loth, MD  CHIEF COMPLAINT:   Chief Complaint  Patient presents with  . Fever    HISTORY OF PRESENT ILLNESS:  Jay Sullivan  is a 44 y.o. Caucasian male with a known history of type 1 diabetes mellitus, anxiety, osteoarthritis and chronic pain, who presented to the emergency room with acute onset of worsening dyspnea with associated cough and fever with wheezing and dizziness since Tuesday and was noted to have associated tachypnea in the ER.  He tested positive for COVID-19 in urgent care on 02/02/2020.  He admitted to diarrhea for couple days but denied any nausea or vomiting.  Is been having palpitations and associated chest pain with cough.  No rhinorrhea or nasal congestion or sore throat or earache.  Upon presentation to the emergency room, temperature was 100.7 and later 103.9 blood pressure was 135/105 with a heart rate of 113 and pulse oximetry was 95% on room air.  CMP was remarkable for mild hyponatremia 133 and his BNP was 29 with LDH of 246 and high-sensitivity troponin I of 6 twice.  Ferritin was 366.  Lactic acid was 1 procalcitonin less than 0.1.  CBC was within normal.  D-dimer was 526.21 and fibrinogen 642.  COVID-19 antigen came back positive.  Blood cultures were drawn.  Chest x-ray showed patchy bilateral opacities consistent with bilateral pneumonia and mild cardiomegaly.  The patient was given IV remdesivir and Decadron as well as 1 g of p.o. Tylenol and 1 L bolus of IV normal saline.  He will be admitted to medical monitored bed for further evaluation and management. PAST MEDICAL HISTORY:   Past Medical History:  Diagnosis Date  . Allergy   . Anxiety   . Arthritis   . Chronic pain   . Diabetes mellitus type I ([facility])     PAST SURGICAL  HISTORY:   Past Surgical History:  Procedure Laterality Date  . 5 shoulder surgery    . ANKLE SURGERY    . back surgery     lumbar surgery  . carapal     bilateral  . 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 JOINT REPLACEMENT      SOCIAL HISTORY:   Social History   Tobacco Use  . Smoking status: Never Smoker  . Smokeless tobacco: Current User    Types: Chew  . Tobacco comment: started smokless tobacco at 44 years old  Substance Use Topics  . Alcohol use: No    FAMILY HISTORY:   Family History  Problem Relation Age of Onset  . Heart attack Mother   . Heart disease Mother   . Stroke Father     DRUG ALLERGIES:   Allergies  Allergen Reactions  . Amoxicillin-Pot Clavulanate Rash  . Gabapentin Other (See Comments)    Suicidal thoughts Suicidal thoughts   . Oxycodone-Acetaminophen Diarrhea and Nausea And Vomiting    Pt can take this medication    REVIEW OF SYSTEMS:   ROS As per history of present illness. All pertinent systems were reviewed above. Constitutional,  HEENT, cardiovascular, respiratory, GI, GU, musculoskeletal, neuro, psychiatric, endocrine,  integumentary and hematologic systems were reviewed and are otherwise  negative/unremarkable except for positive findings mentioned above in the HPI.   MEDICATIONS AT  HOME:   Prior to Admission medications   Medication Sig Start Date End Date Taking? Authorizing Provider  aspirin EC 81 MG tablet Take by mouth.    [provider]  empagliflozin (JARDIANCE) 25 MG TABS tablet Take by mouth. 05/26/19 05/25/20  [provider]  lisinopril (ZESTRIL) 10 MG tablet Take by mouth. 06/02/19   [provider]  mirtazapine (REMERON) 15 MG tablet Take by mouth. 05/26/19 05/25/20  [provider]  zolpidem (AMBIEN) 5 MG tablet Take 10 mg by mouth at bedtime as needed for sleep.    [provider]      VITAL SIGNS:  Blood pressure 112/64, pulse 94, temperature (!) 100.6 F  (38.1 C), temperature source Oral, resp. rate 16, height 5\' 10"  (1.778 m), weight 120.2 kg, SpO2 94 %.  PHYSICAL EXAMINATION:  Physical Exam  GENERAL:  44 y.o.-year-old ill-looking Caucasian male patient lying in the bed mild respiratory distress with conversational dyspnea. EYES: Pupils equal, round, reactive to light and accommodation. No scleral icterus. Extraocular muscles intact.  HEENT: Head atraumatic, normocephalic. Oropharynx and nasopharynx clear.  NECK:  Supple, no jugular venous distention. No thyroid enlargement, no tenderness.  LUNGS: Diminished bibasal breath sounds with bibasal crackles. CARDIOVASCULAR: Regular rate and rhythm, S1, S2 normal. No murmurs, rubs, or gallops.  ABDOMEN: Soft, nondistended, nontender. Bowel sounds present. No organomegaly or mass.  EXTREMITIES: No pedal edema, cyanosis, or clubbing.  NEUROLOGIC: Cranial nerves II through XII are intact. Muscle strength 5/5 in all extremities. Sensation intact. Gait not checked.  PSYCHIATRIC: The patient is alert and oriented x 3.  Normal affect and good eye contact. SKIN: No obvious rash, lesion, or ulcer.   LABORATORY PANEL:   CBC Recent Labs  Lab 02/07/20 0253  WBC 9.1  HGB 16.1  HCT 46.7  PLT 167   ------------------------------------------------------------------------------------------------------------------  Chemistries  Recent Labs  Lab 02/07/20 0253  NA 133*  K 4.4  CL 98  CO2 25  GLUCOSE 138*  BUN 12  CREATININE 0.83  CALCIUM 8.5*  AST 28  ALT 27  ALKPHOS 51  BILITOT 0.7   ------------------------------------------------------------------------------------------------------------------  Cardiac Enzymes No results for input(s): TROPONINI in the last 168 hours. ------------------------------------------------------------------------------------------------------------------  RADIOLOGY:  DG Chest Port 1 View  Result Date: 02/07/2020 CLINICAL DATA:  Cough and chest pain EXAM:  PORTABLE CHEST 1 VIEW COMPARISON:  05/12/2019 FINDINGS: Mild cardiomegaly. Patchy bilateral opacities compatible with pneumonia. No pleural effusion or pneumothorax. IMPRESSION: 1. Patchy bilateral opacities consistent with bilateral pneumonia and history of COVID positivity. 2. Mild cardiomegaly Electronically Signed   By: Donavan Foil M.D.   On: 02/07/2020 03:53      IMPRESSION AND PLAN:     1.  Multifocal pneumonia secondary to COVID-19 with subsequent sepsis without severe sepsis or septic shock. -The patient will be admitted to an isolation monitored bed with droplet and contact precautions. -No antibiotics will be given, given normal procalcitonin. -The patient will be placed on scheduled Mucinex and as needed Tussionex. -We will avoid nebulization as much as we can, give bronchodilator MDI if needed, and with deterioration of oxygenation try to avoid BiPAP/CPAP if possible.    -Will  follow blood cultures which were drawn in the ER. -O2 protocol will be followed. -We will follow CRP, ferritin, LDH and D-dimer. -Will follow manual differential for ANC/ALC ratio as well as follow troponin I and daily CBC with manual differential and CMP. - Will place the patient on IV Remdesivir and IV steroid therapy with Decadron  with elevated inflammatory markers. -The patient will be placed on vitamin D3, vitamin C, zinc sulfate, p.o. Pepcid and aspirin. -Actemra can be considered for CRP more than 7 with associated hypoxemia.  2.  Asthma without acute exacerbation. -The patient will be placed on as needed albuterol MDI.  3.  Type I diabetes mellitus without complications. -The patient will be placed on supplement coverage with NovoLog I will continue Jardiance and Glucotrol XL  4.  Essential hypertension. -We will continue Zestril.  5.  Chronic pain. -We will continue his pain patch and muscle relaxant.  6.  DVT prophylaxis. -Subcutaneous Lovenox. .    All the records are reviewed  and case discussed with ED provider. The plan of care was discussed in details with the patient (and family). I answered all questions. The patient agreed to proceed with the above mentioned plan. Further management will depend upon hospital course.   CODE STATUS: Full code  Status is: Inpatient  Remains inpatient appropriate because:IV treatments appropriate due to intensity of illness or inability to take PO and Inpatient level of care appropriate due to severity of illness   Dispo: The patient is from: Home              Anticipated d/c is to: Home              Anticipated d/c date is: 3 days              Patient currently is not medically stable to d/c.    TOTAL TIME TAKING CARE OF THIS PATIENT:55 minutes.    Hannah Beat M.D on 02/07/2020 at 5:54 AM  [facility]   From 7 PM-7 AM, contact night-coverage [email or web address]  CC: Primary care physician; [facility]   Note: This dictation was prepared with Dragon dictation along with smaller phrase technology. Any transcriptional errors that result from this process are unintentional.

## 2020-02-07 NOTE — ED Notes (Signed)
ED TO INPATIENT HANDOFF REPORT  ED Nurse Name and Phone #: [phone] Jay Sullivan  S Name/Age/Gender Jay Sullivan 44 y.o. male Room/Bed: ED12A/ED12A  Code Status   Code Status: Full Code  Home/SNF/Other Home Patient oriented to: self, place, time and situation Is this baseline? Yes   Triage Complete: Triage complete  Chief Complaint Pneumonia due to COVID-19 virus [U07.1, J12.82]  Triage Note Reports tested COVID positive last Tuesday.  Reports fever that goes up and down, chest discomfort with cough and "feeling" bad.    Allergies Allergies  Allergen Reactions  . Amoxicillin-Pot Clavulanate Rash  . Gabapentin Other (See Comments)    Suicidal thoughts Suicidal thoughts   . Metformin Diarrhea  . Oxycodone-Acetaminophen Diarrhea and Nausea And Vomiting    Pt can take this medication  . Buprenorphine Other (See Comments) and Rash    Level of Care/Admitting Diagnosis ED Disposition    ED Disposition Condition Comment   Admit  Hospital Area: [facility] [[id number]]  Level of Care: Med-Surg [16]  Covid Evaluation: Confirmed COVID Positive  Diagnosis: Pneumonia due to COVID-19 virus [[id number]]  Admitting Physician: Hannah Beat [[id number]]  Attending Physician: Hannah Beat [[id number]]  Estimated length of stay: 3 - 4 days  Certification:: I certify this patient will need inpatient services for at least 2 midnights       B Medical/Surgery History Past Medical History:  Diagnosis Date  . Allergy   . Anxiety   . Arthritis   . Chronic pain   . Diabetes mellitus type I [facility])    Past Surgical History:  Procedure Laterality Date  . 5 shoulder surgery    . ANKLE SURGERY    . back surgery     lumbar surgery  . carapal     bilateral  . 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 JOINT REPLACEMENT       A IV Location/Drains/Wounds Patient Lines/Drains/Airways Status   Active Line/Drains/Airways    Name:   Placement date:   Placement time:    Site:   Days:   Peripheral IV 02/07/20 Right Antecubital   02/07/20    0331    Antecubital   less than 1   Peripheral IV 02/07/20 Left Hand   02/07/20    0332    Hand   less than 1          Intake/Output Last 24 hours  Intake/Output Summary (Last 24 hours) at 02/07/2020 1741 Last data filed at 02/07/2020 0813 Gross per 24 hour  Intake 239.1 ml  Output 625 ml  Net -385.9 ml    Labs/Imaging Results for orders placed or performed during the hospital encounter of 02/07/20 (from the past 48 hour(s))  CBC with Differential     Status: None   Collection Time: 02/07/20  2:53 AM  Result Value Ref Range   WBC 9.1 4.0 - 10.5 K/uL   RBC 5.51 4.22 - 5.81 MIL/uL   Hemoglobin 16.1 13.0 - 17.0 g/dL   HCT 89.2 11.9 - 41.7 %   MCV 84.8 80.0 - 100.0 fL   MCH 29.2 26.0 - 34.0 pg   MCHC 34.5 30.0 - 36.0 g/dL   RDW 40.8 14.4 - 81.8 %   Platelets 167 150 - 400 K/uL   nRBC 0.0 0.0 - 0.2 %   Neutrophils Relative % 54 %   Neutro Abs 4.9 1.7 - 7.7 K/uL   Lymphocytes Relative 41 %   Lymphs Abs  3.7 0.7 - 4.0 K/uL   Monocytes Relative 5 %   Monocytes Absolute 0.4 0.1 - 1.0 K/uL   Eosinophils Relative 0 %   Eosinophils Absolute 0.0 0.0 - 0.5 K/uL   Basophils Relative 0 %   Basophils Absolute 0.0 0.0 - 0.1 K/uL   Immature Granulocytes 0 %   Abs Immature Granulocytes 0.04 0.00 - 0.07 K/uL    Comment: Performed at [facility], [address] [place], [place] [address]  Comprehensive metabolic panel     Status: Abnormal   Collection Time: 02/07/20  2:53 AM  Result Value Ref Range   Sodium 133 (L) 135 - 145 mmol/L   Potassium 4.4 3.5 - 5.1 mmol/L   Chloride 98 98 - 111 mmol/L   CO2 25 22 - 32 mmol/L   Glucose, Bld 138 (H) 70 - 99 mg/dL    Comment: Glucose reference range applies only to samples taken after fasting for at least 8 hours.   BUN 12 6 - 20 mg/dL   Creatinine, Ser 2.95 0.61 - 1.24 mg/dL   Calcium 8.5 (L) 8.9 - 10.3 mg/dL   Total Protein 7.6 6.5 - 8.1 g/dL   Albumin 3.7  3.5 - 5.0 g/dL   AST 28 15 - 41 U/L   ALT 27 0 - 44 U/L   Alkaline Phosphatase 51 38 - 126 U/L   Total Bilirubin 0.7 0.3 - 1.2 mg/dL   GFR calc non Af Amer >60 >60 mL/min   GFR calc Af Amer >60 >60 mL/min   Anion gap 10 5 - 15    Comment: Performed at [facility], [address] [place], [place] [address]  Troponin I (High Sensitivity)     Status: None   Collection Time: 02/07/20  2:53 AM  Result Value Ref Range   Troponin I (High Sensitivity) 6 <18 ng/L    Comment: (NOTE) Elevated high sensitivity troponin I (hsTnI) values and significant  changes across serial measurements may suggest ACS but many other  chronic and acute conditions are known to elevate hsTnI results.  Refer to the "Links" section for chest pain algorithms and additional  guidance. Performed at [facility], [address] [place], [place] [address]   Lactic acid, plasma     Status: None   Collection Time: 02/07/20  2:53 AM  Result Value Ref Range   Lactic Acid, Venous 1.0 0.5 - 1.9 mmol/L    Comment: Performed at [facility], [address] [place], [place] [address]  HIV Antibody (routine testing w rflx)     Status: None   Collection Time: 02/07/20  2:53 AM  Result Value Ref Range   HIV Screen 4th Generation wRfx NON REACTIVE NON REACTIVE    Comment: Performed at [facility], [address] [place], [place] [address]  Brain natriuretic peptide     Status: None   Collection Time: 02/07/20  2:53 AM  Result Value Ref Range   B Natriuretic Peptide 29.0 0.0 - 100.0 pg/mL    Comment: Performed at [facility], [address] [place], [place] [address]  C-reactive protein     Status: Abnormal   Collection Time: 02/07/20  2:53 AM  Result Value Ref Range   CRP 11.1 (H) <1.0 mg/dL    Comment: Performed at [facility], [address] [place], [place] [address]  Fibrin derivatives D-[facility] only)     Status: Abnormal   Collection Time: 02/07/20  2:53  AM  Result Value Ref Range   Fibrin derivatives D-dimer ([facility]) 526.21 (H) 0.00 - 499.00 ng/mL (FEU)    Comment: (NOTE) <> Exclusion of Venous Thromboembolism (VTE) - OUTPATIENT ONLY   (Emergency Department or [place])   0-499 ng/ml (FEU): With a low to intermediate pretest probability                      for VTE this test result excludes the diagnosis                      of VTE.   >499 ng/ml (FEU) : VTE not excluded; additional work up for VTE is                      required. <> Testing on Inpatients and Evaluation of Disseminated Intravascular   Coagulation (DIC) Reference Range:   0-499 ng/ml (FEU) Performed at [facility], [address] [place], [place] [address]   Ferritin     Status: Abnormal   Collection Time: 02/07/20  2:53 AM  Result Value Ref Range   Ferritin 366 (H) 24 - 336 ng/mL    Comment: Performed at [facility], [address] [place], [place] [address]  Fibrinogen     Status: Abnormal   Collection Time: 02/07/20  2:53 AM  Result Value Ref Range   Fibrinogen 642 (H) 210 - 475 mg/dL    Comment: Performed at [facility], [address] [place], [place] [address]  Lactate dehydrogenase     Status: Abnormal   Collection Time: 02/07/20  2:53 AM  Result Value Ref Range   LDH 246 (H) 98 - 192 U/L    Comment: Performed at [facility], [address] [place], [place] [address]  Procalcitonin     Status: None   Collection Time: 02/07/20  2:53 AM  Result Value Ref Range   Procalcitonin <0.10 ng/mL    Comment:        Interpretation: PCT (Procalcitonin) <= 0.5 ng/mL: Systemic infection (sepsis) is not likely. Local bacterial infection is possible. (NOTE)       Sepsis PCT Algorithm           Lower Respiratory Tract                                      Infection PCT Algorithm    ----------------------------     ----------------------------         PCT < 0.25 ng/mL                PCT < 0.10 ng/mL         Strongly  encourage             Strongly discourage   discontinuation of antibiotics    initiation of antibiotics    ----------------------------     -----------------------------       PCT 0.25 - 0.50 ng/mL            PCT 0.10 - 0.25 ng/mL               OR       >80% decrease in PCT            Discourage initiation of  antibiotics      Encourage discontinuation           of antibiotics    ----------------------------     -----------------------------         PCT >= 0.50 ng/mL              PCT 0.26 - 0.50 ng/mL               AND        <80% decrease in PCT             Encourage initiation of                                             antibiotics       Encourage continuation           of antibiotics    ----------------------------     -----------------------------        PCT >= 0.50 ng/mL                  PCT > 0.50 ng/mL               AND         increase in PCT                  Strongly encourage                                      initiation of antibiotics    Strongly encourage escalation           of antibiotics                                     -----------------------------                                           PCT <= 0.25 ng/mL                                                 OR                                        > 80% decrease in PCT                                     Discontinue / Do not initiate                                             antibiotics Performed at [facility], [address], [place], [place] [address]   ABO/Rh     Status: None   Collection Time: 02/07/20  3:18 AM  Result Value Ref Range   ABO/RH(D)      A POS Performed at [facility], [address] [place], [place] [address]   Culture, blood (Routine X 2) w Reflex to ID Panel     Status: None (Preliminary result)   Collection Time: 02/07/20  3:18 AM   Specimen: BLOOD  Result Value Ref Range   Specimen Description BLOOD RIGHT AC     Special Requests      BOTTLES DRAWN AEROBIC AND ANAEROBIC Blood Culture adequate volume   Culture      NO GROWTH < 12 HOURS Performed at [facility], [address], [place], [place] [address]    Report Status PENDING   Culture, blood (Routine X 2) w Reflex to ID Panel     Status: None (Preliminary result)   Collection Time: 02/07/20  3:18 AM   Specimen: BLOOD  Result Value Ref Range   Specimen Description BLOOD LEFT HAND    Special Requests      BOTTLES DRAWN AEROBIC AND ANAEROBIC Blood Culture adequate volume   Culture      NO GROWTH < 12 HOURS Performed at [facility], [address] [place], [place] [address]    Report Status PENDING   POC SARS Coronavirus 2 Ag     Status: Abnormal   Collection Time: 02/07/20  3:50 AM  Result Value Ref Range   SARS Coronavirus 2 Ag POSITIVE (A) NEGATIVE    Comment: (NOTE) SARS-CoV-2 antigen PRESENT. Positive results indicate the presence of viral antigens, but clinical correlation with patient history and other diagnostic information is necessary to determine patient infection status.  Positive results do not rule out bacterial infection or co-infection  with other viruses. False positive results are rare but can occur, and confirmatory RT-PCR testing may be appropriate in some circumstances. The expected result is Negative. Fact Sheet for Patients: https://sanders-williams.net/ Fact Sheet for Providers: https://martinez.com/  This test is not yet approved or cleared by the [place] FDA and  has been authorized for detection and/or diagnosis of SARS-CoV-2 by FDA under an Emergency Use Authorization (EUA).  This EUA will remain in effect (meaning this test can be used) for the duration of  the COVID-19 declaration under Section 564(b)(1) of the Act, 21 U.S.C. section 360bbb-3(b)(1), unless the a uthorization is terminated or revoked sooner.   Troponin I (High Sensitivity)      Status: None   Collection Time: 02/07/20  5:16 AM  Result Value Ref Range   Troponin I (High Sensitivity) 6 <18 ng/L    Comment: (NOTE) Elevated high sensitivity troponin I (hsTnI) values and significant  changes across serial measurements may suggest ACS but many other  chronic and acute conditions are known to elevate hsTnI results.  Refer to the "Links" section for chest pain algorithms and additional  guidance. Performed at [facility], [address] [place], [place] [address]   Glucose, capillary     Status: Abnormal   Collection Time: 02/07/20 10:59 AM  Result Value Ref Range   Glucose-Capillary 229 (H) 70 - 99 mg/dL    Comment: Glucose reference range applies only to samples taken after fasting for at least 8 hours.  Glucose, capillary     Status: Abnormal   Collection Time: 02/07/20  2:43 PM  Result Value Ref Range   Glucose-Capillary 183 (H) 70 - 99 mg/dL    Comment: Glucose reference range applies only to samples taken after fasting for at least 8  hours.   DG Chest Port 1 View  Result Date: 02/07/2020 CLINICAL DATA:  Cough and chest pain EXAM: PORTABLE CHEST 1 VIEW COMPARISON:  05/12/2019 FINDINGS: Mild cardiomegaly. Patchy bilateral opacities compatible with pneumonia. No pleural effusion or pneumothorax. IMPRESSION: 1. Patchy bilateral opacities consistent with bilateral pneumonia and history of COVID positivity. 2. Mild cardiomegaly Electronically Signed   By: Jasmine PangKim  Fujinaga M.D.   On: 02/07/2020 03:53    Pending Labs Unresulted Labs (From admission, onward)    Start     Ordered   02/08/20 0500  CBC with Differential/Platelet  Daily,   STAT     02/07/20 0544   02/08/20 0500  Comprehensive metabolic panel  Daily,   STAT     02/07/20 0544   02/08/20 0500  C-reactive protein  Daily,   STAT     02/07/20 0544   02/08/20 0500  Fibrin derivatives D-Dimer ([facility] only)  Daily,   STAT     02/07/20 0544   02/08/20 0500  Ferritin  Daily,   STAT     02/07/20 0544    02/07/20 0541  Hemoglobin A1c  Once,   STAT    Comments: To assess prior glycemic control    02/07/20 0541   02/07/20 0255  Lactic acid, plasma  Now then every 2 hours,   STAT     02/07/20 0254          Vitals/Pain Today's Vitals   02/07/20 1100 02/07/20 1102 02/07/20 1300 02/07/20 1400  BP: 117/70 117/70 122/74 (!) 110/51  Pulse: 78 80 85 78  Resp:      Temp:  98.8 F (37.1 C)    TempSrc:  Oral    SpO2: 95% 96% 97% 95%  Weight:      Height:      PainSc:  7       Isolation Precautions Airborne and Contact precautions  Medications Medications  remdesivir 200 mg in sodium chloride 0.9% 250 mL IVPB (0 mg Intravenous Stopped 02/07/20 0813)    Followed by  remdesivir 100 mg in sodium chloride 0.9 % 100 mL IVPB (has no administration in time range)  dexamethasone (DECADRON) injection 6 mg (6 mg Intravenous Given 02/07/20 0514)  aspirin EC tablet 81 mg (81 mg Oral Given 02/07/20 1053)  lisinopril (ZESTRIL) tablet 10 mg (10 mg Oral Given 02/07/20 1053)  mirtazapine (REMERON) tablet 15 mg (has no administration in time range)  zolpidem (AMBIEN) tablet 5 mg (has no administration in time range)  insulin aspart (novoLOG) injection 0-20 Units (4 Units Subcutaneous Given 02/07/20 1450)  enoxaparin (LOVENOX) injection 40 mg (40 mg Subcutaneous Given 02/07/20 0743)  0.9 %  sodium chloride infusion ( Intravenous New Bag/Given 02/07/20 0740)  guaiFENesin-dextromethorphan (ROBITUSSIN DM) 100-10 MG/5ML syrup 10 mL (has no administration in time range)  chlorpheniramine-HYDROcodone (TUSSIONEX) 10-8 MG/5ML suspension 5 mL (5 mLs Oral Given 02/07/20 1646)  ascorbic acid (VITAMIN C) tablet 500 mg (500 mg Oral Given 02/07/20 1053)  zinc sulfate capsule 220 mg (220 mg Oral Given 02/07/20 1052)  famotidine (PEPCID) tablet 20 mg (20 mg Oral Given 02/07/20 1052)  acetaminophen (TYLENOL) tablet 650 mg (has no administration in time range)  traZODone (DESYREL) tablet 25 mg (has no administration in time  range)  magnesium hydroxide (MILK OF MAGNESIA) suspension 30 mL (has no administration in time range)  ondansetron (ZOFRAN) tablet 4 mg (has no administration in time range)    Or  ondansetron (ZOFRAN) injection 4 mg (has no administration in time  range)  cholecalciferol (VITAMIN D3) tablet 1,000 Units (1,000 Units Oral Given 02/07/20 1052)  guaiFENesin (MUCINEX) 12 hr tablet 600 mg (600 mg Oral Given 02/07/20 1053)  acetaminophen (TYLENOL) tablet 1,000 mg (1,000 mg Oral Given 02/07/20 0340)  sodium chloride 0.9 % bolus 1,000 mL (0 mLs Intravenous Stopped 02/07/20 0505)  chlorpheniramine-HYDROcodone (TUSSIONEX) 10-8 MG/5ML suspension 5 mL (5 mLs Oral Given 02/07/20 0401)    Mobility walks     Focused Assessments Cardiac Assessment Handoff:    Lab Results  Component Value Date   CKTOTAL 174 05/12/2019   CKMB 1.6 11/24/2014   TROPONINI <0.03 04/01/2019   No results found for: DDIMER Does the Patient currently have chest pain? No    If patient is a Neuro Trauma and patient is going to OR before floor call report to [facility] nurse: [phone] or [phone]     R Recommendations: See Admitting Provider Note  Report given to:   Additional Notes: Covid +

## 2020-02-07 NOTE — Progress Notes (Addendum)
Same day rounding progress note  Seen and examined while in the ED.  Much more comfortable.  Feels somewhat better than yesterday.  Thankful for his care.  1. Multifocal pneumonia secondary to COVID-19 with subsequent sepsis without severe sepsis or septic shock -present on admission - an isolation monitored bed with droplet and contact precautions.  He is on room air now - 96% -No antibiotics will be given, given normal procalcitonin. - scheduled Mucinex and as needed Tussionex. - bronchodilator MDI if needed, and with deterioration of oxygenation try to avoid BiPAP/CPAP if possible.  -neg blood cultures  -O2 protocol will be followed. - follow CRP, ferritin, LDH and D-dimer. - IV Remdesivir day 1/5 and IV steroid therapy with Decadron day 1/10  -Monitor elevated inflammatory markers. -continue vitamin D3, vitamin C, zinc sulfate, p.o. Pepcid and aspirin. -Actemra can be considered for CRP more than 7 with associated hypoxemia.  2.  Asthma without acute exacerbation. -as needed albuterol MDI.  3.  Type I diabetes mellitus without complications. - supplement coverage with NovoLog, continue Jardiance and Glucotrol XL  4.  Essential hypertension. - continue Zestril.  5.  Chronic pain. - continue his pain patch and muscle relaxant.  6.  DVT prophylaxis. -Subcutaneous Lovenox. . Time spent - 20 minutes

## 2020-02-07 NOTE — ED Notes (Signed)
Pt was 100% on RA, ambulated pt in room on RA per MD order.  Pt appeared to become sob and was having labored breathing.  Pt spo2 dropped to 94%.  When pt sat back on stretcher he was visibly fatigued from ambulating and said that the activity had worn him out.  MD notified.

## 2020-02-08 LAB — COMPREHENSIVE METABOLIC PANEL
ALT: 23 U/L (ref 0–44)
AST: 23 U/L (ref 15–41)
Albumin: 3.1 g/dL — ABNORMAL LOW (ref 3.5–5.0)
Alkaline Phosphatase: 39 U/L (ref 38–126)
Anion gap: 6 (ref 5–15)
BUN: 20 mg/dL (ref 6–20)
CO2: 24 mmol/L (ref 22–32)
Calcium: 8.6 mg/dL — ABNORMAL LOW (ref 8.9–10.3)
Chloride: 109 mmol/L (ref 98–111)
Creatinine, Ser: 0.64 mg/dL (ref 0.61–1.24)
GFR calc Af Amer: >60 mL/min (ref >60)
GFR calc non Af Amer: >60 mL/min (ref >60)
Glucose, Bld: 130 mg/dL — ABNORMAL HIGH (ref 70–99)
Potassium: 4.6 mmol/L (ref 3.5–5.1)
Sodium: 139 mmol/L (ref 135–145)
Total Bilirubin: 0.7 mg/dL (ref 0.3–1.2)
Total Protein: 6.6 g/dL (ref 6.5–8.1)

## 2020-02-08 LAB — CBC WITH DIFFERENTIAL/PLATELET
Abs Immature Granulocytes: 0.05 10*3/uL (ref 0.00–0.07)
Basophils Absolute: 0 10*3/uL (ref 0.0–0.1)
Basophils Relative: 0 %
Eosinophils Absolute: 0 10*3/uL (ref 0.0–0.5)
Eosinophils Relative: 0 %
HCT: 45.8 % (ref 39.0–52.0)
Hemoglobin: 15 g/dL (ref 13.0–17.0)
Immature Granulocytes: 1 %
Lymphocytes Relative: 27 %
Lymphs Abs: 2 10*3/uL (ref 0.7–4.0)
MCH: 29.1 pg (ref 26.0–34.0)
MCHC: 32.8 g/dL (ref 30.0–36.0)
MCV: 88.8 fL (ref 80.0–100.0)
Monocytes Absolute: 0.4 10*3/uL (ref 0.1–1.0)
Monocytes Relative: 5 %
Neutro Abs: 4.8 10*3/uL (ref 1.7–7.7)
Neutrophils Relative %: 67 %
Platelets: 149 10*3/uL — ABNORMAL LOW (ref 150–400)
RBC: 5.16 MIL/uL (ref 4.22–5.81)
RDW: 13.4 % (ref 11.5–15.5)
WBC: 7.1 10*3/uL (ref 4.0–10.5)
nRBC: 0 % (ref 0.0–0.2)

## 2020-02-08 LAB — GLUCOSE, CAPILLARY
Glucose-Capillary: 188 mg/dL — ABNORMAL HIGH (ref 70–99)
Glucose-Capillary: 220 mg/dL — ABNORMAL HIGH (ref 70–99)
Glucose-Capillary: 182 mg/dL — ABNORMAL HIGH (ref 70–99)
Glucose-Capillary: 132 mg/dL — ABNORMAL HIGH (ref 70–99)

## 2020-02-08 LAB — C-REACTIVE PROTEIN: CRP: 7.2 mg/dL — ABNORMAL HIGH (ref <1.0)

## 2020-02-08 LAB — FERRITIN: Ferritin: 484 ng/mL — ABNORMAL HIGH (ref 24–336)

## 2020-02-08 LAB — FIBRIN DERIVATIVES D-DIMER (ARMC ONLY): Fibrin derivatives D-dimer (ARMC): 359.67 ng/mL (FEU) (ref 0.00–499.00)

## 2020-02-08 MED ORDER — CIPROFLOXACIN-DEXAMETHASONE 0.3-0.1 % OT SUSP
4.0000 [drp] | Freq: Two times a day (BID) | OTIC | Status: DC
  Administered 2020-02-08 – 2020-02-09 (×2): 4 [drp] via OTIC
  Filled 2020-02-08: qty 7.5

## 2020-02-08 NOTE — Progress Notes (Signed)
1        [place] [facility]   PATIENT NAME: Jay Sullivan    MR#:  [id number]  DATE OF BIRTH:  Aug 03, 1976  SUBJECTIVE:  CHIEF COMPLAINT:   Chief Complaint  Patient presents with  . Fever  Afebrile.  Walking in the room.  Very appreciative of his care here.  Some shortness of breath and cough REVIEW OF SYSTEMS:  Review of Systems  Constitutional: Negative for diaphoresis, fever, malaise/fatigue and weight loss.  HENT: Negative for ear discharge, ear pain, hearing loss, nosebleeds, sore throat and tinnitus.   Eyes: Negative for blurred vision and pain.  Respiratory: Positive for cough and shortness of breath. Negative for hemoptysis and wheezing.   Cardiovascular: Negative for chest pain, palpitations, orthopnea and leg swelling.  Gastrointestinal: Negative for abdominal pain, blood in stool, constipation, diarrhea, heartburn, nausea and vomiting.  Genitourinary: Negative for dysuria, frequency and urgency.  Musculoskeletal: Negative for back pain and myalgias.  Skin: Negative for itching and rash.  Neurological: Negative for dizziness, tingling, tremors, focal weakness, seizures, weakness and headaches.  Psychiatric/Behavioral: Negative for depression. The patient is not nervous/anxious.    DRUG ALLERGIES:   Allergies  Allergen Reactions  . Amoxicillin-Pot Clavulanate Rash  . Gabapentin Other (See Comments)    Suicidal thoughts Suicidal thoughts   . Metformin Diarrhea  . Oxycodone-Acetaminophen Diarrhea and Nausea And Vomiting    Pt can take this medication  . Buprenorphine Other (See Comments) and Rash   VITALS:  Blood pressure (!) 142/68, pulse 66, temperature 98.7 F (37.1 C), temperature source Oral, resp. rate 16, height 5\' 10"  (1.778 m), weight 120.2 kg, SpO2 96 %. PHYSICAL EXAMINATION:  Physical Exam HENT:     Head: Normocephalic and atraumatic.  Eyes:     Conjunctiva/sclera: Conjunctivae normal.     Pupils: Pupils are equal, round, and reactive  to light.  Neck:     Thyroid: No thyromegaly.     Trachea: No tracheal deviation.  Cardiovascular:     Rate and Rhythm: Normal rate and regular rhythm.     Heart sounds: Normal heart sounds.  Pulmonary:     Effort: Pulmonary effort is normal. No respiratory distress.     Breath sounds: Normal breath sounds. No wheezing.  Chest:     Chest wall: No tenderness.  Abdominal:     General: Bowel sounds are normal. There is no distension.     Palpations: Abdomen is soft.     Tenderness: There is no abdominal tenderness.  Musculoskeletal:        General: Normal range of motion.     Cervical back: Normal range of motion and neck supple.  Skin:    General: Skin is warm and dry.     Findings: No rash.  Neurological:     Mental Status: He is alert and oriented to person, place, and time.     Cranial Nerves: No cranial nerve deficit.    LABORATORY PANEL:  Male CBC Recent Labs  Lab 02/08/20 0655  WBC 7.1  HGB 15.0  HCT 45.8  PLT 149*   ------------------------------------------------------------------------------------------------------------------ Chemistries  Recent Labs  Lab 02/08/20 0655  NA 139  K 4.6  CL 109  CO2 24  GLUCOSE 130*  BUN 20  CREATININE 0.64  CALCIUM 8.6*  AST 23  ALT 23  ALKPHOS 39  BILITOT 0.7   RADIOLOGY:  No results found. ASSESSMENT AND PLAN:   1. Multifocal pneumonia secondary to COVID-19with subsequent sepsis without severe sepsis  or septic shock -present on admission - an isolation monitored bed with droplet and contact precautions.  He is on room air now - 96% -No antibiotics will be given, given normal procalcitonin. - scheduled Mucinex and as needed Tussionex. - bronchodilator MDI if needed, and with deterioration of oxygenation try to avoid BiPAP/CPAP if possible.  -neg blood cultures -O2 protocol will be followed. - follow CRP, ferritin, LDH and D-dimer. - IV Remdesivir day 2/5 and IV steroid therapy with Decadron day 2/10   -Monitor elevated inflammatory markers. -continue vitamin D3, vitamin C, zinc sulfate, p.o. Pepcid and aspirin. -Actemra can be considered for CRP more than 7 with associated hypoxemia.  2. Asthma without acute exacerbation. -as needed albuterol MDI.  3. Type I diabetes mellituswithout complications. - supplement coverage with NovoLog, continue Jardiance and Glucotrol XL  4. Essential hypertension. - continue Zestril.  5. Chronic pain. - continue his pain patch and muscle relaxant.    Status is: Inpatient  Remains inpatient appropriate because:Inpatient level of care appropriate due to severity of illness   Dispo: The patient is from: Home              Anticipated d/c is to: Home              Anticipated d/c date is: 3 days              Patient currently is not medically stable to d/c.     DVT prophylaxis: Lovenox Family Communication: discussed with patient   All the records are reviewed and case discussed with Care Management/Social Worker. Management plans discussed with the patient, nursing and they are in agreement.  CODE STATUS: Full Code  TOTAL TIME TAKING CARE OF THIS PATIENT: 35 minutes.   More than 50% of the time was spent in counseling/coordination of care: YES  POSSIBLE D/C IN 3 DAYS, DEPENDING ON CLINICAL CONDITION.   Max Sane M.D on 02/08/2020 at 12:45 PM  [facility]   CC: Primary care physician; [place]  Note: This dictation was prepared with Dragon dictation along with smaller phrase technology. Any transcriptional errors that result from this process are unintentional.

## 2020-02-08 NOTE — Progress Notes (Signed)
Alerted Dr. Sherryll Burger that patient would like to take a shower and concerned about a possible ear infection in his right ear. MD acknowledged. Dr. Sherryll Burger stated it is okay for patient to shower.

## 2020-02-09 DIAGNOSIS — R509 Fever, unspecified: Secondary | ICD-10-CM

## 2020-02-09 DIAGNOSIS — R05 Cough: Secondary | ICD-10-CM

## 2020-02-09 DIAGNOSIS — R059 Cough, unspecified: Secondary | ICD-10-CM

## 2020-02-09 LAB — CBC WITH DIFFERENTIAL/PLATELET
Abs Immature Granulocytes: 0.05 10*3/uL (ref 0.00–0.07)
Basophils Absolute: 0 10*3/uL (ref 0.0–0.1)
Basophils Relative: 0 %
Eosinophils Absolute: 0 10*3/uL (ref 0.0–0.5)
Eosinophils Relative: 0 %
HCT: 41.2 % (ref 39.0–52.0)
Hemoglobin: 13.4 g/dL (ref 13.0–17.0)
Immature Granulocytes: 1 %
Lymphocytes Relative: 34 %
Lymphs Abs: 3.1 10*3/uL (ref 0.7–4.0)
MCH: 29.3 pg (ref 26.0–34.0)
MCHC: 32.5 g/dL (ref 30.0–36.0)
MCV: 90 fL (ref 80.0–100.0)
Monocytes Absolute: 0.5 10*3/uL (ref 0.1–1.0)
Monocytes Relative: 5 %
Neutro Abs: 5.5 10*3/uL (ref 1.7–7.7)
Neutrophils Relative %: 60 %
Platelets: 192 10*3/uL (ref 150–400)
RBC: 4.58 MIL/uL (ref 4.22–5.81)
RDW: 13.7 % (ref 11.5–15.5)
WBC: 9.1 10*3/uL (ref 4.0–10.5)
nRBC: 0 % (ref 0.0–0.2)

## 2020-02-09 LAB — COMPREHENSIVE METABOLIC PANEL
ALT: 23 U/L (ref 0–44)
AST: 23 U/L (ref 15–41)
Albumin: 3.1 g/dL — ABNORMAL LOW (ref 3.5–5.0)
Alkaline Phosphatase: 43 U/L (ref 38–126)
Anion gap: 10 (ref 5–15)
BUN: 20 mg/dL (ref 6–20)
CO2: 25 mmol/L (ref 22–32)
Calcium: 8.5 mg/dL — ABNORMAL LOW (ref 8.9–10.3)
Chloride: 106 mmol/L (ref 98–111)
Creatinine, Ser: 0.88 mg/dL (ref 0.61–1.24)
GFR calc Af Amer: >60 mL/min (ref >60)
GFR calc non Af Amer: >60 mL/min (ref >60)
Glucose, Bld: 104 mg/dL — ABNORMAL HIGH (ref 70–99)
Potassium: 4.3 mmol/L (ref 3.5–5.1)
Sodium: 141 mmol/L (ref 135–145)
Total Bilirubin: 0.6 mg/dL (ref 0.3–1.2)
Total Protein: 6.4 g/dL — ABNORMAL LOW (ref 6.5–8.1)

## 2020-02-09 LAB — GLUCOSE, CAPILLARY: Glucose-Capillary: 117 mg/dL — ABNORMAL HIGH (ref 70–99)

## 2020-02-09 LAB — TSH: TSH: 2.576 u[IU]/mL (ref 0.350–4.500)

## 2020-02-09 LAB — FERRITIN: Ferritin: 483 ng/mL — ABNORMAL HIGH (ref 24–336)

## 2020-02-09 LAB — HEMOGLOBIN A1C
Hgb A1c MFr Bld: 6.8 % — ABNORMAL HIGH (ref 4.8–5.6)
Mean Plasma Glucose: 148 mg/dL

## 2020-02-09 LAB — FIBRIN DERIVATIVES D-DIMER (ARMC ONLY): Fibrin derivatives D-dimer (ARMC): 347.59 ng/mL (FEU) (ref 0.00–499.00)

## 2020-02-09 LAB — C-REACTIVE PROTEIN: CRP: 2.9 mg/dL — ABNORMAL HIGH (ref <1.0)

## 2020-02-09 MED ORDER — GUAIFENESIN-DM 100-10 MG/5ML PO SYRP: 10.0000 mL | ORAL_SOLUTION | ORAL | 0 refills | Status: AC | PRN

## 2020-02-09 MED ORDER — ZINC SULFATE 220 (50 ZN) MG PO CAPS: 220.0000 mg | ORAL_CAPSULE | Freq: Every day | ORAL | 0 refills | Status: AC

## 2020-02-09 MED ORDER — ASCORBIC ACID 500 MG PO TABS: 500.0000 mg | ORAL_TABLET | Freq: Every day | ORAL | 0 refills | Status: AC

## 2020-02-09 NOTE — Discharge Instructions (Addendum)
You are scheduled for an outpatient infusion of Remdesivir at 11:30 AM on Thursday 4/15 and Friday 4/16.   Please report to [facility] at [address]  Drive to the security guard and tell them you are here for an infusion. They will direct you to the front entrance where we will come and get you.  For questions call [phone].  Thanks     COVID-19 COVID-19 is a respiratory infection that is caused by a virus called severe acute respiratory syndrome coronavirus 2 (SARS-CoV-2). The disease is also known as coronavirus disease or novel coronavirus. In some people, the virus may not cause any symptoms. In others, it may cause a serious infection. The infection can get worse quickly and can lead to complications, such as:  Pneumonia, or infection of the lungs.  Acute respiratory distress syndrome or ARDS. This is a condition in which fluid build-up in the lungs prevents the lungs from filling with air and passing oxygen into the blood.  Acute respiratory failure. This is a condition in which there is not enough oxygen passing from the lungs to the body or when carbon dioxide is not passing from the lungs out of the body.  Sepsis or septic shock. This is a serious bodily reaction to an infection.  Blood clotting problems.  Secondary infections due to bacteria or fungus.  Organ failure. This is when your body's organs stop working. The virus that causes COVID-19 is contagious. This means that it can spread from person to person through droplets from coughs and sneezes (respiratory secretions). What are the causes? This illness is caused by a virus. You may catch the virus by:  Breathing in droplets from an infected person. Droplets can be spread by a person breathing, speaking, singing, coughing, or sneezing.  Touching something, like a table or a doorknob, that was exposed to the virus (contaminated) and then touching your mouth, nose, or eyes. What increases the risk? Risk  for infection You are more likely to be infected with this virus if you:  Are within 6 feet (2 meters) of a person with COVID-19.  Provide care for or live with a person who is infected with COVID-19.  Spend time in crowded indoor spaces or live in shared housing. Risk for serious illness You are more likely to become seriously ill from the virus if you:  Are 45 years of age or older. The higher your age, the more you are at risk for serious illness.  Live in a nursing home or long-term care facility.  Have cancer.  Have a long-term (chronic) disease such as: ? Chronic lung disease, including chronic obstructive pulmonary disease or asthma. ? A long-term disease that lowers your body's ability to fight infection (immunocompromised). ? Heart disease, including heart failure, a condition in which the arteries that lead to the heart become narrow or blocked (coronary artery disease), a disease which makes the heart muscle thick, weak, or stiff (cardiomyopathy). ? Diabetes. ? Chronic kidney disease. ? Sickle cell disease, a condition in which red blood cells have an abnormal "sickle" shape. ? Liver disease.  Are obese. What are the signs or symptoms? Symptoms of this condition can range from mild to severe. Symptoms may appear any time from 2 to 14 days after being exposed to the virus. They include:  A fever or chills.  A cough.  Difficulty breathing.  Headaches, body aches, or muscle aches.  Runny or stuffy (congested) nose.  A sore throat.  New loss of taste or smell. Some people may also have stomach problems, such as nausea, vomiting, or diarrhea. Other people may not have any symptoms of COVID-19. How is this diagnosed? This condition may be diagnosed based on:  Your signs and symptoms, especially if: ? You live in an area with a COVID-19 outbreak. ? You recently traveled to or from an area where the virus is common. ? You provide care for or live with a person  who was diagnosed with COVID-19. ? You were exposed to a person who was diagnosed with COVID-19.  A physical exam.  Lab tests, which may include: ? Taking a sample of fluid from the back of your nose and throat (nasopharyngeal fluid), your nose, or your throat using a swab. ? A sample of mucus from your lungs (sputum). ? Blood tests.  Imaging tests, which may include, X-rays, CT scan, or ultrasound. How is this treated? At present, there is no medicine to treat COVID-19. Medicines that treat other diseases are being used on a trial basis to see if they are effective against COVID-19. Your health care provider will talk with you about ways to treat your symptoms. For most people, the infection is mild and can be managed at home with rest, fluids, and over-the-counter medicines. Treatment for a serious infection usually takes places in a hospital intensive care unit (ICU). It may include one or more of the following treatments. These treatments are given until your symptoms improve.  Receiving fluids and medicines through an IV.  Supplemental oxygen. Extra oxygen is given through a tube in the nose, a face mask, or a hood.  Positioning you to lie on your stomach (prone position). This makes it easier for oxygen to get into the lungs.  Continuous positive airway pressure (CPAP) or bi-level positive airway pressure (BPAP) machine. This treatment uses mild air pressure to keep the airways open. A tube that is connected to a motor delivers oxygen to the body.  Ventilator. This treatment moves air into and out of the lungs by using a tube that is placed in your windpipe.  Tracheostomy. This is a procedure to create a hole in the neck so that a breathing tube can be inserted.  Extracorporeal membrane oxygenation (ECMO). This procedure gives the lungs a chance to recover by taking over the functions of the heart and lungs. It supplies oxygen to the body and removes carbon dioxide. Follow these  instructions at home: Lifestyle  If you are sick, stay home except to get medical care. Your health care provider will tell you how long to stay home. Call your health care provider before you go for medical care.  Rest at home as told by your health care provider.  Do not use any products that contain nicotine or tobacco, such as cigarettes, e-cigarettes, and chewing tobacco. If you need help quitting, ask your health care provider.  Return to your normal activities as told by your health care provider. Ask your health care provider what activities are safe for you. General instructions  Take over-the-counter and prescription medicines only as told by your health care provider.  Drink enough fluid to keep your urine pale yellow.  Keep all follow-up visits as told by your health care provider. This is important. How is this prevented?  There is no vaccine to help prevent COVID-19 infection. However, there are steps you can take to protect yourself and others from this virus. To protect yourself:   Do not travel to  areas where COVID-19 is a risk. The areas where COVID-19 is reported change often. To identify high-risk areas and travel restrictions, check the CDC travel website: StageSync.si  If you live in, or must travel to, an area where COVID-19 is a risk, take precautions to avoid infection. ? Stay away from people who are sick. ? Wash your hands often with soap and water for 20 seconds. If soap and water are not available, use an alcohol-based hand sanitizer. ? Avoid touching your mouth, face, eyes, or nose. ? Avoid going out in public, follow guidance from your state and local health authorities. ? If you must go out in public, wear a cloth face covering or face mask. Make sure your mask covers your nose and mouth. ? Avoid crowded indoor spaces. Stay at least 6 feet (2 meters) away from others. ? Disinfect objects and surfaces that are frequently touched every day.  This may include:  Counters and tables.  Doorknobs and light switches.  Sinks and faucets.  Electronics, such as phones, remote controls, keyboards, computers, and tablets. To protect others: If you have symptoms of COVID-19, take steps to prevent the virus from spreading to others.  If you think you have a COVID-19 infection, contact your health care provider right away. Tell your health care team that you think you may have a COVID-19 infection.  Stay home. Leave your house only to seek medical care. Do not use public transport.  Do not travel while you are sick.  Wash your hands often with soap and water for 20 seconds. If soap and water are not available, use alcohol-based hand sanitizer.  Stay away from other members of your household. Let healthy household members care for children and pets, if possible. If you have to care for children or pets, wash your hands often and wear a mask. If possible, stay in your own room, separate from others. Use a different bathroom.  Make sure that all people in your household wash their hands well and often.  Cough or sneeze into a tissue or your sleeve or elbow. Do not cough or sneeze into your hand or into the air.  Wear a cloth face covering or face mask. Make sure your mask covers your nose and mouth. Where to find more information  Centers for Disease Control and Prevention: StickerEmporium.tn  World Health Organization: https://thompson-craig.com/ Contact a health care provider if:  You live in or have traveled to an area where COVID-19 is a risk and you have symptoms of the infection.  You have had contact with someone who has COVID-19 and you have symptoms of the infection. Get help right away if:  You have trouble breathing.  You have pain or pressure in your chest.  You have confusion.  You have bluish lips and fingernails.  You have difficulty waking from sleep.  You have symptoms  that get worse. These symptoms may represent a serious problem that is an emergency. Do not wait to see if the symptoms will go away. Get medical help right away. Call your local emergency services (911 in the [place] Do not drive yourself to the hospital. Let the emergency medical personnel know if you think you have COVID-19. Summary  COVID-19 is a respiratory infection that is caused by a virus. It is also known as coronavirus disease or novel coronavirus. It can cause serious infections, such as pneumonia, acute respiratory distress syndrome, acute respiratory failure, or sepsis.  The virus that causes COVID-19 is contagious. This means  that it can spread from person to person through droplets from breathing, speaking, singing, coughing, or sneezing.  You are more likely to develop a serious illness if you are 65 years of age or older, have a weak immune system, live in a nursing home, or have chronic disease.  There is no medicine to treat COVID-19. Your health care provider will talk with you about ways to treat your symptoms.  Take steps to protect yourself and others from infection. Wash your hands often and disinfect objects and surfaces that are frequently touched every day. Stay away from people who are sick and wear a mask if you are sick. This information is not intended to replace advice given to you by your health care provider. Make sure you discuss any questions you have with your health care provider. Document Revised: 08/13/2019 Document Reviewed: 11/19/2018 Elsevier Patient Education  2020 Elsevier Inc.  COVID-19: How to Protect Yourself and Others Know how it spreads  There is currently no vaccine to prevent coronavirus disease 2019 (COVID-19).  The best way to prevent illness is to avoid being exposed to this virus.  The virus is thought to spread mainly from person-to-person. ? Between people who are in close contact with one another (within about 6 feet). ? Through  respiratory droplets produced when an infected person coughs, sneezes or talks. ? These droplets can land in the mouths or noses of people who are nearby or possibly be inhaled into the lungs. ? COVID-19 may be spread by people who are not showing symptoms. Everyone should Clean your hands often  Wash your hands often with soap and water for at least 20 seconds especially after you have been in a public place, or after blowing your nose, coughing, or sneezing.  If soap and water are not readily available, use a hand sanitizer that contains at least 60% alcohol. Cover all surfaces of your hands and rub them together until they feel dry.  Avoid touching your eyes, nose, and mouth with unwashed hands. Avoid close contact  Limit contact with others as much as possible.  Avoid close contact with people who are sick.  Put distance between yourself and other people. ? Remember that some people without symptoms may be able to spread virus. ? This is especially important for people who are at higher risk of getting very RetroStamps.it Cover your mouth and nose with a mask when around others  You could spread COVID-19 to others even if you do not feel sick.  Everyone should wear a mask in public settings and when around people not living in their household, especially when social distancing is difficult to maintain. ? Masks should not be placed on young children under age 57, anyone who has trouble breathing, or is unconscious, incapacitated or otherwise unable to remove the mask without assistance.  The mask is meant to protect other people in case you are infected.  Do NOT use a facemask meant for a Research scientist (physical sciences).  Continue to keep about 6 feet between yourself and others. The mask is not a substitute for social distancing. Cover coughs and sneezes  Always cover your mouth and nose with a tissue when you cough or  sneeze or use the inside of your elbow.  Throw used tissues in the trash.  Immediately wash your hands with soap and water for at least 20 seconds. If soap and water are not readily available, clean your hands with a hand sanitizer that contains at least 60% alcohol.  Clean and disinfect  Clean AND disinfect frequently touched surfaces daily. This includes tables, doorknobs, light switches, countertops, handles, desks, phones, keyboards, toilets, faucets, and sinks. ktimeonline.com  If surfaces are dirty, clean them: Use detergent or soap and water prior to disinfection.  Then, use a household disinfectant. You can see a list of EPA-registered household disinfectants here. SouthAmericaFlowers.co.uk 06/30/2019 This information is not intended to replace advice given to you by your health care provider. Make sure you discuss any questions you have with your health care provider. Document Revised: 07/08/2019 Document Reviewed: 05/06/2019 Elsevier Patient Education  2020 [employer].   COVID-19 Frequently Asked Questions COVID-19 (coronavirus disease) is an infection that is caused by a large family of viruses. Some viruses cause illness in people and others cause illness in animals like camels, cats, and bats. In some cases, the viruses that cause illness in animals can spread to humans. Where did the coronavirus come from? In December 2019, [place] told the [employer] [facility]) of several cases of lung disease (human respiratory illness). These cases were linked to an open seafood and livestock market in the city of [place]. The link to the seafood and livestock market suggests that the virus may have spread from animals to humans. However, since that first outbreak in December, the virus has also been shown to spread from person to person. What is the name of the disease and the virus? Disease name Early on, this disease was  called novel coronavirus. This is because scientists determined that the disease was caused by a new (novel) respiratory virus. The World Health Organization [facility]) has now named the disease COVID-19, or coronavirus disease. Virus name The virus that causes the disease is called severe acute respiratory syndrome coronavirus 2 (SARS-CoV-2). More information on disease and virus naming World Health Organization [facility]): www.who.int/emergencies/diseases/novel-coronavirus-2019/technical-guidance/naming-the-coronavirus-disease-(covid-2019)-and-the-virus-that-causes-it Who is at risk for complications from coronavirus disease? Some people may be at higher risk for complications from coronavirus disease. This includes older adults and people who have chronic diseases, such as heart disease, diabetes, and lung disease. If you are at higher risk for complications, take these extra precautions:  Stay home as much as possible.  Avoid social gatherings and travel.  Avoid close contact with others. Stay at least 6 ft (2 m) away from others, if possible.  Wash your hands often with soap and water for at least 20 seconds.  Avoid touching your face, mouth, nose, or eyes.  Keep supplies on hand at home, such as food, medicine, and cleaning supplies.  If you must go out in public, wear a cloth face covering or face mask. Make sure your mask covers your nose and mouth. How does coronavirus disease spread? The virus that causes coronavirus disease spreads easily from person to person (is contagious). You may catch the virus by:  Breathing in droplets from an infected person. Droplets can be spread by a person breathing, speaking, singing, coughing, or sneezing.  Touching something, like a table or a doorknob, that was exposed to the virus (contaminated) and then touching your mouth, nose, or eyes. Can I get the virus from touching surfaces or objects? There is still a lot that we do not know about the virus  that causes coronavirus disease. Scientists are basing a lot of information on what they know about similar viruses, such as:  Viruses cannot generally survive on surfaces for long. They need a human body (host) to survive.  It is more likely that the virus is spread by  close contact with people who are sick (direct contact), such as through: ? Shaking hands or hugging. ? Breathing in respiratory droplets that travel through the air. Droplets can be spread by a person breathing, speaking, singing, coughing, or sneezing.  It is less likely that the virus is spread when a person touches a surface or object that has the virus on it (indirect contact). The virus may be able to enter the body if the person touches a surface or object and then touches his or her face, eyes, nose, or mouth. Can a person spread the virus without having symptoms of the disease? It may be possible for the virus to spread before a person has symptoms of the disease, but this is most likely not the main way the virus is spreading. It is more likely for the virus to spread by being in close contact with people who are sick and breathing in the respiratory droplets spread by a person breathing, speaking, singing, coughing, or sneezing. What are the symptoms of coronavirus disease? Symptoms vary from person to person and can range from mild to severe. Symptoms may include:  Fever or chills.  Cough.  Difficulty breathing or feeling short of breath.  Headaches, body aches, or muscle aches.  Runny or stuffy (congested) nose.  Sore throat.  New loss of taste or smell.  Nausea, vomiting, or diarrhea. These symptoms can appear anywhere from 2 to 14 days after you have been exposed to the virus. Some people may not have any symptoms. If you develop symptoms, call your health care provider. People with severe symptoms may need hospital care. Should I be tested for this virus? Your health care provider will decide whether to  test you based on your symptoms, history of exposure, and your risk factors. How does a health care provider test for this virus? Health care providers will collect samples to send for testing. Samples may include:  Taking a swab of fluid from the back of your nose and throat, your nose, or your throat.  Taking fluid from the lungs by having you cough up mucus (sputum) into a sterile cup.  Taking a blood sample. Is there a treatment or vaccine for this virus? Currently, there is no vaccine to prevent coronavirus disease. Also, there are no medicines like antibiotics or antivirals to treat the virus. A person who becomes sick is given supportive care, which means rest and fluids. A person may also relieve his or her symptoms by using over-the-counter medicines that treat sneezing, coughing, and runny nose. These are the same medicines that a person takes for the common cold. If you develop symptoms, call your health care provider. People with severe symptoms may need hospital care. What can I do to protect myself and my family from this virus?     You can protect yourself and your family by taking the same actions that you would take to prevent the spread of other viruses. Take the following actions:  Wash your hands often with soap and water for at least 20 seconds. If soap and water are not available, use alcohol-based hand sanitizer.  Avoid touching your face, mouth, nose, or eyes.  Cough or sneeze into a tissue, sleeve, or elbow. Do not cough or sneeze into your hand or the air. ? If you cough or sneeze into a tissue, throw it away immediately and wash your hands.  Disinfect objects and surfaces that you frequently touch every day.  Stay away from people who are  sick.  Avoid going out in public, follow guidance from your state and local health authorities.  Avoid crowded indoor spaces. Stay at least 6 ft (2 m) away from others.  If you must go out in public, wear a cloth face  covering or face mask. Make sure your mask covers your nose and mouth.  Stay home if you are sick, except to get medical care. Call your health care provider before you get medical care. Your health care provider will tell you how long to stay home.  Make sure your vaccines are up to date. Ask your health care provider what vaccines you need. What should I do if I need to travel? Follow travel recommendations from your local health authority, the CDC, and WHO. Travel information and advice  Centers for Disease Control and Prevention (CDC): GeminiCard.gl  World Health Organization [facility]): PreviewDomains.se Know the risks and take action to protect your health  You are at higher risk of getting coronavirus disease if you are traveling to areas with an outbreak or if you are exposed to travelers from areas with an outbreak.  Wash your hands often and practice good hygiene to lower the risk of catching or spreading the virus. What should I do if I am sick? General instructions to stop the spread of infection  Wash your hands often with soap and water for at least 20 seconds. If soap and water are not available, use alcohol-based hand sanitizer.  Cough or sneeze into a tissue, sleeve, or elbow. Do not cough or sneeze into your hand or the air.  If you cough or sneeze into a tissue, throw it away immediately and wash your hands.  Stay home unless you must get medical care. Call your health care provider or local health authority before you get medical care.  Avoid public areas. Do not take public transportation, if possible.  If you can, wear a mask if you must go out of the house or if you are in close contact with someone who is not sick. Make sure your mask covers your nose and mouth. Keep your home clean  Disinfect objects and surfaces that are frequently touched every day. This may  include: ? Counters and tables. ? Doorknobs and light switches. ? Sinks and faucets. ? Electronics such as phones, remote controls, keyboards, computers, and tablets.  Wash dishes in hot, soapy water or use a dishwasher. Air-dry your dishes.  Wash laundry in hot water. Prevent infecting other household members  Let healthy household members care for children and pets, if possible. If you have to care for children or pets, wash your hands often and wear a mask.  Sleep in a different bedroom or bed, if possible.  Do not share personal items, such as razors, toothbrushes, deodorant, combs, brushes, towels, and washcloths. Where to find more information Centers for Disease Control and Prevention (CDC)  Information and news updates: CardRetirement.cz World Health Organization [facility])  Information and news updates: AffordableSalon.es  Coronavirus health topic: https://thompson-craig.com/  Questions and answers on COVID-19: kruiseway.com  Global tracker: who.sprinklr.com American Academy of Pediatrics (AAP)  Information for families: [email or web address]-[email or web address] The coronavirus situation is changing rapidly. Check your local health authority website or the CDC and [facility] websites for updates and news. When should I contact a health care provider?  Contact your health care provider if you have symptoms of an infection, such as fever or cough, and you: ? Have been near anyone who is known to have coronavirus disease. ? Have  come into contact with a person who is suspected to have coronavirus disease. ? Have traveled to an area where there is an outbreak of COVID-19. When should I get emergency medical care?  Get help right away by calling your local emergency services (911 in the [place] if you have: ? Trouble  breathing. ? Pain or pressure in your chest. ? Confusion. ? Blue-tinged lips and fingernails. ? Difficulty waking from sleep. ? Symptoms that get worse. Let the emergency medical personnel know if you think you have coronavirus disease. Summary  A new respiratory virus is spreading from person to person and causing COVID-19 (coronavirus disease).  The virus that causes COVID-19 appears to spread easily. It spreads from one person to another through droplets from breathing, speaking, singing, coughing, or sneezing.  Older adults and those with chronic diseases are at higher risk of disease. If you are at higher risk for complications, take extra precautions.  There is currently no vaccine to prevent coronavirus disease. There are no medicines, such as antibiotics or antivirals, to treat the virus.  You can protect yourself and your family by washing your hands often, avoiding touching your face, and covering your coughs and sneezes. This information is not intended to replace advice given to you by your health care provider. Make sure you discuss any questions you have with your health care provider. Document Revised: 08/13/2019 Document Reviewed: 02/09/2019 Elsevier Patient Education  2020 Elsevier Inc.  COVID-19: Quarantine vs. Isolation QUARANTINE keeps someone who was in close contact with someone who has COVID-19 away from others. If you had close contact with a person who has COVID-19  Stay home until 14 days after your last contact.  Check your temperature twice a day and watch for symptoms of COVID-19.  If possible, stay away from people who are at higher-risk for getting very sick from COVID-19. ISOLATION keeps someone who is sick or tested positive for COVID-19 without symptoms away from others, even in their own home. If you are sick and think or know you have COVID-19  Stay home until after ? At least 10 days since symptoms first appeared and ? At least 24 hours with  no fever without fever-reducing medication and ? Symptoms have improved If you tested positive for COVID-19 but do not have symptoms  Stay home until after ? 10 days have passed since your positive test If you live with others, stay in a specific "sick room" or area and away from other people or animals, including pets. Use a separate bathroom, if available. [email or web address] 05/17/2019 This information is not intended to replace advice given to you by your health care provider. Make sure you discuss any questions you have with your health care provider. Document Revised: 09/30/2019 Document Reviewed: 09/30/2019 Elsevier Patient Education  2020 Elsevier Inc.  10 Things You Can Do to Manage Your COVID-19 Symptoms at Home If you have possible or confirmed COVID-19: 1. Stay home from work and school. And stay away from other public places. If you must go out, avoid using any kind of public transportation, ridesharing, or taxis. 2. Monitor your symptoms carefully. If your symptoms get worse, call your healthcare provider immediately. 3. Get rest and stay hydrated. 4. If you have a medical appointment, call the healthcare provider ahead of time and tell them that you have or may have COVID-19. 5. For medical emergencies, call 911 and notify the dispatch personnel that you have or may have COVID-19. 6. Cover your cough and sneezes with  a tissue or use the inside of your elbow. 7. Wash your hands often with soap and water for at least 20 seconds or clean your hands with an alcohol-based hand sanitizer that contains at least 60% alcohol. 8. As much as possible, stay in a specific room and away from other people in your home. Also, you should use a separate bathroom, if available. If you need to be around other people in or outside of the home, wear a mask. 9. Avoid sharing personal items with other people in your household, like dishes, towels, and bedding. 10. Clean all surfaces that are touched  often, like counters, tabletops, and doorknobs. Use household cleaning sprays or wipes according to the label instructions. [email or web address] 04/28/2019 This information is not intended to replace advice given to you by your health care provider. Make sure you discuss any questions you have with your health care provider. Document Revised: 09/30/2019 Document Reviewed: 09/30/2019 Elsevier Patient Education  2020 Elsevier Inc.    Person Under Monitoring Name: Jay Sullivan  Location: [address] [place] [address]   CORONAVIRUS DISEASE 2019 (COVID-19) Guidance for Persons Under Investigation You are being tested for the virus that causes coronavirus disease 2019 (COVID-19). Public health actions are necessary to ensure protection of your health and the health of others, and to prevent further spread of infection. COVID-19 is caused by a virus that can cause symptoms, such as fever, cough, and shortness of breath. The primary transmission from person to person is by coughing or sneezing. On November 26, 2018, the World Health Organization announced a [employer] Emergency of International Concern and on November 27, 2018 the [place] Department of Health and Human Services declared a public health emergency. If the virus that causesCOVID-19 spreads in the community, it could have severe public health consequences.  As a person under investigation for COVID-19, the [employer] [facility] [employer] Division of [employer] advises you to adhere to the following guidance until your test results are reported to you. If your test result is positive, you will receive additional information from your provider and your local health department at that time.   Remain at home until you are cleared by your health provider or public health authorities.   Keep a log of visitors to your home using the form provided. Any visitors to your home must be aware of your  isolation status.  If you plan to move to a new address or leave the county, notify the local health department in your county.  Call a doctor or seek care if you have an urgent medical need. Before seeking medical care, call ahead and get instructions from the provider before arriving at the medical office, clinic or hospital. Notify them that you are being tested for the virus that causes COVID-19 so arrangements can be made, as necessary, to prevent transmission to others in the healthcare setting. Next, notify the local health department in your county.  If a medical emergency arises and you need to call 911, inform the first responders that you are being tested for the virus that causes COVID-19. Next, notify the local health department in your county.  Adhere to all guidance set forth by the [facility] Division of [employer] for [facility] of patients that is based on guidance from the Center for Disease Control and Prevention with suspected or confirmed COVID-19. It is provided with this guidance for Persons Under Investigation.  Your health  and the health of our community are our top priorities. Public Health officials remain available to provide assistance and counseling to you about COVID-19 and compliance with this guidance.  Provider: ____________________________________________________________ Date: ______/_____/_________  By signing below, you acknowledge that you have read and agree to comply with this Guidance for Persons Under Investigation. ______________________________________________________________ Date: ______/_____/_________  WHO DO I CALL? You can find a list of local health departments here: [email or web address] Health Department: ____________________________________________________________________ Contact Name: ________________________________________________________________________ Telephone:  ___________________________________________________________________________  Marice Potter, [place], Communicable Disease Branch COVID-19 Guidance for Persons Under Investigation January 02, 2019   Person Under Monitoring Name: Jay Sullivan  Location: [address] [place] [address]   Infection Prevention Recommendations for Individuals Confirmed to have, or Being Evaluated for, 2019 Novel Coronavirus (COVID-19) Infection Who Receive Care at Home  Individuals who are confirmed to have, or are being evaluated for, COVID-19 should follow the prevention steps below until a healthcare provider or local or state health department says they can return to normal activities.  Stay home except to get medical care You should restrict activities outside your home, except for getting medical care. Do not go to work, school, or public areas, and do not use public transportation or taxis.  Call ahead before visiting your doctor Before your medical appointment, call the healthcare provider and tell them that you have, or are being evaluated for, COVID-19 infection. This will help the healthcare provider's office take steps to keep other people from getting infected. Ask your healthcare provider to call the local or state health department.  Monitor your symptoms Seek prompt medical attention if your illness is worsening (e.g., difficulty breathing). Before going to your medical appointment, call the healthcare provider and tell them that you have, or are being evaluated for, COVID-19 infection. Ask your healthcare provider to call the local or state health department.  Wear a facemask You should wear a facemask that covers your nose and mouth when you are in the same room with other people and when you visit a healthcare provider. People who live with or visit you should also wear a facemask while they are in the same room with you.  Separate yourself from  other people in your home As much as possible, you should stay in a different room from other people in your home. Also, you should use a separate bathroom, if available.  Avoid sharing household items You should not share dishes, drinking glasses, cups, eating utensils, towels, bedding, or other items with other people in your home. After using these items, you should wash them thoroughly with soap and water.  Cover your coughs and sneezes Cover your mouth and nose with a tissue when you cough or sneeze, or you can cough or sneeze into your sleeve. Throw used tissues in a lined trash can, and immediately wash your hands with soap and water for at least 20 seconds or use an alcohol-based hand rub.  Wash your [facility] your hands often and thoroughly with soap and water for at least 20 seconds. You can use an alcohol-based hand sanitizer if soap and water are not available and if your hands are not visibly dirty. Avoid touching your eyes, nose, and mouth with unwashed hands.   Prevention Steps for Caregivers and Household Members of Individuals Confirmed to have, or Being Evaluated for, COVID-19 Infection Being Cared for in the Home  If you live with, or provide care at home for, a  person confirmed to have, or being evaluated for, COVID-19 infection please follow these guidelines to prevent infection:  Follow healthcare provider's instructions Make sure that you understand and can help the patient follow any healthcare provider instructions for all care.  Provide for the patient's basic needs You should help the patient with basic needs in the home and provide support for getting groceries, prescriptions, and other personal needs.  Monitor the patient's symptoms If they are getting sicker, call his or her medical provider and tell them that the patient has, or is being evaluated for, COVID-19 infection. This will help the healthcare provider's office take steps to keep other people  from getting infected. Ask the healthcare provider to call the local or state health department.  Limit the number of people who have contact with the patient  If possible, have only one caregiver for the patient.  Other household members should stay in another home or place of residence. If this is not possible, they should stay  in another room, or be separated from the patient as much as possible. Use a separate bathroom, if available.  Restrict visitors who do not have an essential need to be in the home.  Keep older adults, very young children, and other sick people away from the patient Keep older adults, very young children, and those who have compromised immune systems or chronic health conditions away from the patient. This includes people with chronic heart, lung, or kidney conditions, diabetes, and cancer.  Ensure good ventilation Make sure that shared spaces in the home have good air flow, such as from an air conditioner or an opened window, weather permitting.  Wash your hands often  Wash your hands often and thoroughly with soap and water for at least 20 seconds. You can use an alcohol based hand sanitizer if soap and water are not available and if your hands are not visibly dirty.  Avoid touching your eyes, nose, and mouth with unwashed hands.  Use disposable paper towels to dry your hands. If not available, use dedicated cloth towels and replace them when they become wet.  Wear a facemask and gloves  Wear a disposable facemask at all times in the room and gloves when you touch or have contact with the patient's blood, body fluids, and/or secretions or excretions, such as sweat, saliva, sputum, nasal mucus, vomit, urine, or feces.  Ensure the mask fits over your nose and mouth tightly, and do not touch it during use.  Throw out disposable facemasks and gloves after using them. Do not reuse.  Wash your hands immediately after removing your facemask and gloves.  If  your personal clothing becomes contaminated, carefully remove clothing and launder. Wash your hands after handling contaminated clothing.  Place all used disposable facemasks, gloves, and other waste in a lined container before disposing them with other household waste.  Remove gloves and wash your hands immediately after handling these items.  Do not share dishes, glasses, or other household items with the patient  Avoid sharing household items. You should not share dishes, drinking glasses, cups, eating utensils, towels, bedding, or other items with a patient who is confirmed to have, or being evaluated for, COVID-19 infection.  After the person uses these items, you should wash them thoroughly with soap and water.  Wash laundry thoroughly  Immediately remove and wash clothes or bedding that have blood, body fluids, and/or secretions or excretions, such as sweat, saliva, sputum, nasal mucus, vomit, urine, or feces, on them.  Wear gloves when handling laundry from the patient.  Read and follow directions on labels of laundry or clothing items and detergent. In general, wash and dry with the warmest temperatures recommended on the label.  Clean all areas the individual has used often  Clean all touchable surfaces, such as counters, tabletops, doorknobs, bathroom fixtures, toilets, phones, keyboards, tablets, and bedside tables, every day. Also, clean any surfaces that may have blood, body fluids, and/or secretions or excretions on them.  Wear gloves when cleaning surfaces the patient has come in contact with.  Use a diluted bleach solution (e.g., dilute bleach with 1 part bleach and 10 parts water) or a household disinfectant with a label that says EPA-registered for coronaviruses. To make a bleach solution at home, add 1 tablespoon of bleach to 1 quart (4 cups) of water. For a larger supply, add  cup of bleach to 1 gallon (16 cups) of water.  Read labels of cleaning products and follow  recommendations provided on product labels. Labels contain instructions for safe and effective use of the cleaning product including precautions you should take when applying the product, such as wearing gloves or eye protection and making sure you have good ventilation during use of the product.  Remove gloves and wash hands immediately after cleaning.  Monitor yourself for signs and symptoms of illness Caregivers and household members are considered close contacts, should monitor their health, and will be asked to limit movement outside of the home to the extent possible. Follow the monitoring steps for close contacts listed on the symptom monitoring form.   ? If you have additional questions, contact your local health department or call the epidemiologist on call at [phone] (available 24/7). ? This guidance is subject to change. For the most up-to-date guidance from [facility], please refer to their website: [email or web address]

## 2020-02-09 NOTE — Progress Notes (Signed)
Patient scheduled for outpatient Remdesivir infusion at 11:30 AM on Thursday 4/15 and Friday 4/16. Please advise them to report to [facility] at [address]  Drive to the security guard and tell them you are here for an infusion. They will direct you to the front entrance where we will come and get you.  For questions call [phone].  Thanks

## 2020-02-09 NOTE — Discharge Summary (Signed)
[facility] [place] NAME: Jay Sullivan    MR#:  [id number]  DATE OF BIRTH:  [birth year]  DATE OF ADMISSION:  02/07/2020   ADMITTING PHYSICIAN: Christel Mormon, MD  DATE OF DISCHARGE: 02/09/2020 11:35 AM  PRIMARY CARE PHYSICIAN: [place]   ADMISSION DIAGNOSIS:  Cough [R05] Chest pain [R07.9] Fever, unspecified fever cause [R50.9] Pneumonia due to COVID-19 virus [U07.1, J12.82] DISCHARGE DIAGNOSIS:  Active Problems:   Pneumonia due to COVID-19 virus   Cough   Fever  SECONDARY DIAGNOSIS:   Past Medical History:  Diagnosis Date  . Allergy   . Anxiety   . Arthritis   . Chronic pain   . Diabetes mellitus type I [facility])    HOSPITAL COURSE:  1. Multifocal pneumonia secondary to COVID-19with subsequent sepsis without severe sepsis or septic shock-present on admission -He is on room air now -96% -No antibiotics given - normal procalcitonin. -negblood cultures -completed 3/5 of remdesivir and will take 2 remaining doses at outpatient remdesivir clinic in [place] starting tomorrow  2. Asthma without acute exacerbation. -as needed albuterol MDI.  3. Type I diabetes mellituswithout complications. -continue Jardiance and Glucotrol XL  4. Essential hypertension. -continue Zestril.  5. Chronic pain. -continue his pain patch and muscle relaxant.   DISCHARGE CONDITIONS:  Stable CONSULTS OBTAINED:   DRUG ALLERGIES:   Allergies  Allergen Reactions  . Amoxicillin-Pot Clavulanate Rash  . Gabapentin Other (See Comments)    Suicidal thoughts Suicidal thoughts   . Metformin Diarrhea  . Oxycodone-Acetaminophen Diarrhea and Nausea And Vomiting    Pt can take this medication  . Buprenorphine Other (See Comments) and Rash   DISCHARGE MEDICATIONS:   Allergies as of 02/09/2020      Reactions   Amoxicillin-pot Clavulanate Rash   Gabapentin Other (See Comments)   Suicidal thoughts Suicidal thoughts   Metformin  Diarrhea   Oxycodone-acetaminophen Diarrhea, Nausea And Vomiting   Pt can take this medication   Buprenorphine Other (See Comments), Rash      Medication List    TAKE these medications   ascorbic acid 500 MG tablet Commonly known as: VITAMIN C Take 1 tablet (500 mg total) by mouth daily. Start taking on: February 10, 2020   aspirin EC 81 MG tablet Take 81 mg by mouth.   empagliflozin 25 MG Tabs tablet Commonly known as: JARDIANCE Take 25 mg by mouth daily.   guaiFENesin-dextromethorphan 100-10 MG/5ML syrup Commonly known as: ROBITUSSIN DM Take 10 mLs by mouth every 4 (four) hours as needed for cough.   lisinopril 10 MG tablet Commonly known as: ZESTRIL Take 10 mg by mouth daily.   mirtazapine 15 MG tablet Commonly known as: REMERON Take by mouth.   zinc sulfate 220 (50 Zn) MG capsule Take 1 capsule (220 mg total) by mouth daily. Start taking on: February 10, 2020   zolpidem 5 MG tablet Commonly known as: AMBIEN Take 10 mg by mouth at bedtime as needed for sleep.      DISCHARGE INSTRUCTIONS:   DIET:  Cardiac diet DISCHARGE CONDITION:  Stable ACTIVITY:  Activity as tolerated OXYGEN:  Home Oxygen: No.  Oxygen Delivery: room air DISCHARGE LOCATION:  home   If you experience worsening of your admission symptoms, develop shortness of breath, life threatening emergency, suicidal or homicidal thoughts you must seek medical attention immediately by calling 911 or calling your MD immediately  if symptoms less severe.  You Must read complete  instructions/literature along with all the possible adverse reactions/side effects for all the Medicines you take and that have been prescribed to you. Take any new Medicines after you have completely understood and accpet all the possible adverse reactions/side effects.   Please note  You were cared for by a hospitalist during your hospital stay. If you have any questions about your discharge medications or the care you received  while you were in the hospital after you are discharged, you can call the unit and asked to speak with the hospitalist on call if the hospitalist that took care of you is not available. Once you are discharged, your primary care physician will handle any further medical issues. Please note that NO REFILLS for any discharge medications will be authorized once you are discharged, as it is imperative that you return to your primary care physician (or establish a relationship with a primary care physician if you do not have one) for your aftercare needs so that they can reassess your need for medications and monitor your lab values.    On the day of Discharge:  VITAL SIGNS:  Blood pressure 122/69, pulse 69, temperature 98.7 F (37.1 C), temperature source Oral, resp. rate (!) 22, height 5\' 10"  (1.778 m), weight 120.2 kg, SpO2 99 %. PHYSICAL EXAMINATION:  GENERAL:  44 y.o.-year-old patient lying in the bed with no acute distress.  EYES: Pupils equal, round, reactive to light and accommodation. No scleral icterus. Extraocular muscles intact.  HEENT: Head atraumatic, normocephalic. Oropharynx and nasopharynx clear.  NECK:  Supple, no jugular venous distention. No thyroid enlargement, no tenderness.  LUNGS: Normal breath sounds bilaterally, no wheezing, rales,rhonchi or crepitation. No use of accessory muscles of respiration.  CARDIOVASCULAR: S1, S2 normal. No murmurs, rubs, or gallops.  ABDOMEN: Soft, non-tender, non-distended. Bowel sounds present. No organomegaly or mass.  EXTREMITIES: No pedal edema, cyanosis, or clubbing.  NEUROLOGIC: Cranial nerves II through XII are intact. Muscle strength 5/5 in all extremities. Sensation intact. Gait not checked.  PSYCHIATRIC: The patient is alert and oriented x 3.  SKIN: No obvious rash, lesion, or ulcer.  DATA REVIEW:   CBC Recent Labs  Lab 02/09/20 0608  WBC 9.1  HGB 13.4  HCT 41.2  PLT 192    Chemistries  Recent Labs  Lab 02/09/20 0608  NA  141  K 4.3  CL 106  CO2 25  GLUCOSE 104*  BUN 20  CREATININE 0.88  CALCIUM 8.5*  AST 23  ALT 23  ALKPHOS 43  BILITOT 0.6     Outpatient follow-up Follow-up Information    [facility] Go on 02/11/2020.   Why: at 2:15 p.m.- arrive 30 min. early  Contact information: [address] [facility] [phone]       [facility] Go on 02/16/2020.   Why: at 10:30 a.m. Contact information: [address] [place] [phone]            Management plans discussed with the patient, family and they are in agreement.  CODE STATUS: Full Code   TOTAL TIME TAKING CARE OF THIS PATIENT: 45 minutes.    [phone] M.D on 02/09/2020 at 4:02 PM  [facility]   CC: Primary care physician; [facility]   Note: This dictation was prepared with Dragon dictation along with smaller phrase technology. Any transcriptional errors that result from this process are unintentional.

## 2020-02-09 NOTE — Progress Notes (Signed)
Patient with HR-40s. Patient asleep, arouses easily with voice. Vital signs check and patient asymptomatic. No distress noted, no SOB or chest pain.

## 2020-02-09 NOTE — Progress Notes (Signed)
Discharge instructions provided to patient. All questions answered. Breathing is even and unlabored. No distress noted. IVs removed. Tele monitor removed and CCMD called. All patient belongings gathered and taken with patient. Confirmed with Dr. Sherryll Burger that patient is okay to go considering episode of bradycardia last night, MD confirmed that patient is good to go. Patient is stable. Will continue to monitor. Awaiting transportation.

## 2020-02-09 NOTE — Progress Notes (Signed)
Pateint with asymptomatic bradycardia.  No medications noted to be contributing factor that I can see. Reviewed history in Epic and recent PCP annual visit note heart rate 90. Tele review per nurse reveals episodes prior not sustained.  He did have  work up prior for tachycardia TSH added to am labs and EKG ordered.for am along with orthostatic vitals

## 2020-02-10 ENCOUNTER — Ambulatory Visit (HOSPITAL_COMMUNITY)
Admission: RE | Admit: 2020-02-10 | Discharge: 2020-02-10 | Disposition: A | Discharge status: Home / Self Care | Payer: BC Managed Care – PPO | Source: Ambulatory Visit | Attending: Pulmonary Disease | Admitting: Pulmonary Disease

## 2020-02-10 DIAGNOSIS — J1282 Pneumonia due to coronavirus disease 2019: Secondary | ICD-10-CM | POA: Insufficient documentation

## 2020-02-10 DIAGNOSIS — U071 COVID-19: Secondary | ICD-10-CM | POA: Insufficient documentation

## 2020-02-10 MED ORDER — METHYLPREDNISOLONE SODIUM SUCC 125 MG IJ SOLR: 125.0000 mg | Freq: Once | INTRAMUSCULAR | Status: DC | PRN

## 2020-02-10 MED ORDER — SODIUM CHLORIDE 0.9 % IV SOLN
100.0000 mg | Freq: Once | INTRAVENOUS | Status: AC
  Administered 2020-02-10: 12:00:00 100 mg via INTRAVENOUS
  Filled 2020-02-10: qty 20

## 2020-02-10 MED ORDER — FAMOTIDINE IN NACL 20-0.9 MG/50ML-% IV SOLN: 20.0000 mg | Freq: Once | INTRAVENOUS | Status: DC | PRN

## 2020-02-10 MED ORDER — ALBUTEROL SULFATE HFA 108 (90 BASE) MCG/ACT IN AERS: 2.0000 | INHALATION_SPRAY | Freq: Once | RESPIRATORY_TRACT | Status: DC | PRN

## 2020-02-10 MED ORDER — SODIUM CHLORIDE 0.9 % IV SOLN: INTRAVENOUS | Status: DC | PRN

## 2020-02-10 MED ORDER — EPINEPHRINE 0.3 MG/0.3ML IJ SOAJ: 0.3000 mg | Freq: Once | INTRAMUSCULAR | Status: DC | PRN

## 2020-02-10 MED ORDER — DIPHENHYDRAMINE HCL 50 MG/ML IJ SOLN: 50.0000 mg | Freq: Once | INTRAMUSCULAR | Status: DC | PRN

## 2020-02-10 NOTE — Progress Notes (Signed)
  Diagnosis: COVID-19  Physician:Dr Wright  Procedure: [facility] Med: remdesivir infusion - Provided patient with remdesivir fact sheet for patients, parents and caregivers prior to infusion.  Complications: No immediate complications noted.  Discharge: Discharged home   Jay Sullivan 02/10/2020   

## 2020-02-10 NOTE — Discharge Instructions (Signed)
10 Things You Can Do to Manage Your COVID-19 Symptoms at Home If you have possible or confirmed COVID-19: 1. Stay home from work and school. And stay away from other public places. If you must go out, avoid using any kind of public transportation, ridesharing, or taxis. 2. Monitor your symptoms carefully. If your symptoms get worse, call your healthcare provider immediately. 3. Get rest and stay hydrated. 4. If you have a medical appointment, call the healthcare provider ahead of time and tell them that you have or may have COVID-19. 5. For medical emergencies, call 911 and notify the dispatch personnel that you have or may have COVID-19. 6. Cover your cough and sneezes with a tissue or use the inside of your elbow. 7. Wash your hands often with soap and water for at least 20 seconds or clean your hands with an alcohol-based hand sanitizer that contains at least 60% alcohol. 8. As much as possible, stay in a specific room and away from other people in your home. Also, you should use a separate bathroom, if available. If you need to be around other people in or outside of the home, wear a mask. 9. Avoid sharing personal items with other people in your household, like dishes, towels, and bedding. 10. Clean all surfaces that are touched often, like counters, tabletops, and doorknobs. Use household cleaning sprays or wipes according to the label instructions. cdc.gov/coronavirus 04/28/2019 This information is not intended to replace advice given to you by your health care provider. Make sure you discuss any questions you have with your health care provider. Document Revised: 09/30/2019 Document Reviewed: 09/30/2019 Elsevier Patient Education  2020 Elsevier Inc.  

## 2020-02-11 ENCOUNTER — Ambulatory Visit (HOSPITAL_COMMUNITY)
Admit: 2020-02-11 | Discharge: 2020-02-11 | Disposition: A | Discharge status: Home / Self Care | Payer: BC Managed Care – PPO | Attending: Pulmonary Disease | Admitting: Pulmonary Disease

## 2020-02-11 DIAGNOSIS — U071 COVID-19: Secondary | ICD-10-CM | POA: Diagnosis not present

## 2020-02-11 MED ORDER — METHYLPREDNISOLONE SODIUM SUCC 125 MG IJ SOLR: 125.0000 mg | Freq: Once | INTRAMUSCULAR | Status: DC | PRN

## 2020-02-11 MED ORDER — SODIUM CHLORIDE 0.9 % IV SOLN: INTRAVENOUS | Status: DC | PRN

## 2020-02-11 MED ORDER — DIPHENHYDRAMINE HCL 50 MG/ML IJ SOLN: 50.0000 mg | Freq: Once | INTRAMUSCULAR | Status: DC | PRN

## 2020-02-11 MED ORDER — EPINEPHRINE 0.3 MG/0.3ML IJ SOAJ: 0.3000 mg | Freq: Once | INTRAMUSCULAR | Status: DC | PRN

## 2020-02-11 MED ORDER — SODIUM CHLORIDE 0.9 % IV SOLN
100.0000 mg | Freq: Once | INTRAVENOUS | Status: AC
  Administered 2020-02-11: 100 mg via INTRAVENOUS
  Filled 2020-02-11: qty 20

## 2020-02-11 MED ORDER — ALBUTEROL SULFATE HFA 108 (90 BASE) MCG/ACT IN AERS: 2.0000 | INHALATION_SPRAY | Freq: Once | RESPIRATORY_TRACT | Status: DC | PRN

## 2020-02-11 MED ORDER — FAMOTIDINE IN NACL 20-0.9 MG/50ML-% IV SOLN: 20.0000 mg | Freq: Once | INTRAVENOUS | Status: DC | PRN

## 2020-02-11 NOTE — Progress Notes (Signed)
  Diagnosis: COVID-19  Physician:wright  Procedure: [facility] Med: remdesivir infusion - Provided patient with remdesivir fact sheet for patients, parents and caregivers prior to infusion.  Complications: No immediate complications noted.  Discharge: Discharged home   Shaune Spittle 02/11/2020

## 2020-02-12 LAB — CULTURE, BLOOD (ROUTINE X 2)
Culture: NO GROWTH
Culture: NO GROWTH
Special Requests: ADEQUATE
Special Requests: ADEQUATE

## 2020-02-22 ENCOUNTER — Other Ambulatory Visit: Payer: Self-pay | Admitting: Internal Medicine

## 2020-02-22 DIAGNOSIS — J1282 Pneumonia due to coronavirus disease 2019: Secondary | ICD-10-CM

## 2020-03-07 ENCOUNTER — Other Ambulatory Visit: Payer: Self-pay | Admitting: Internal Medicine

## 2020-09-22 IMAGING — DX PORTABLE CHEST - 1 VIEW
1 series · 1 of 1 positions shown · non-contrast
Comparison: Radiograph April 01, 2019.

CLINICAL DATA: Dyspnea, fever.

EXAM:
PORTABLE CHEST 1 VIEW

[chest ap]
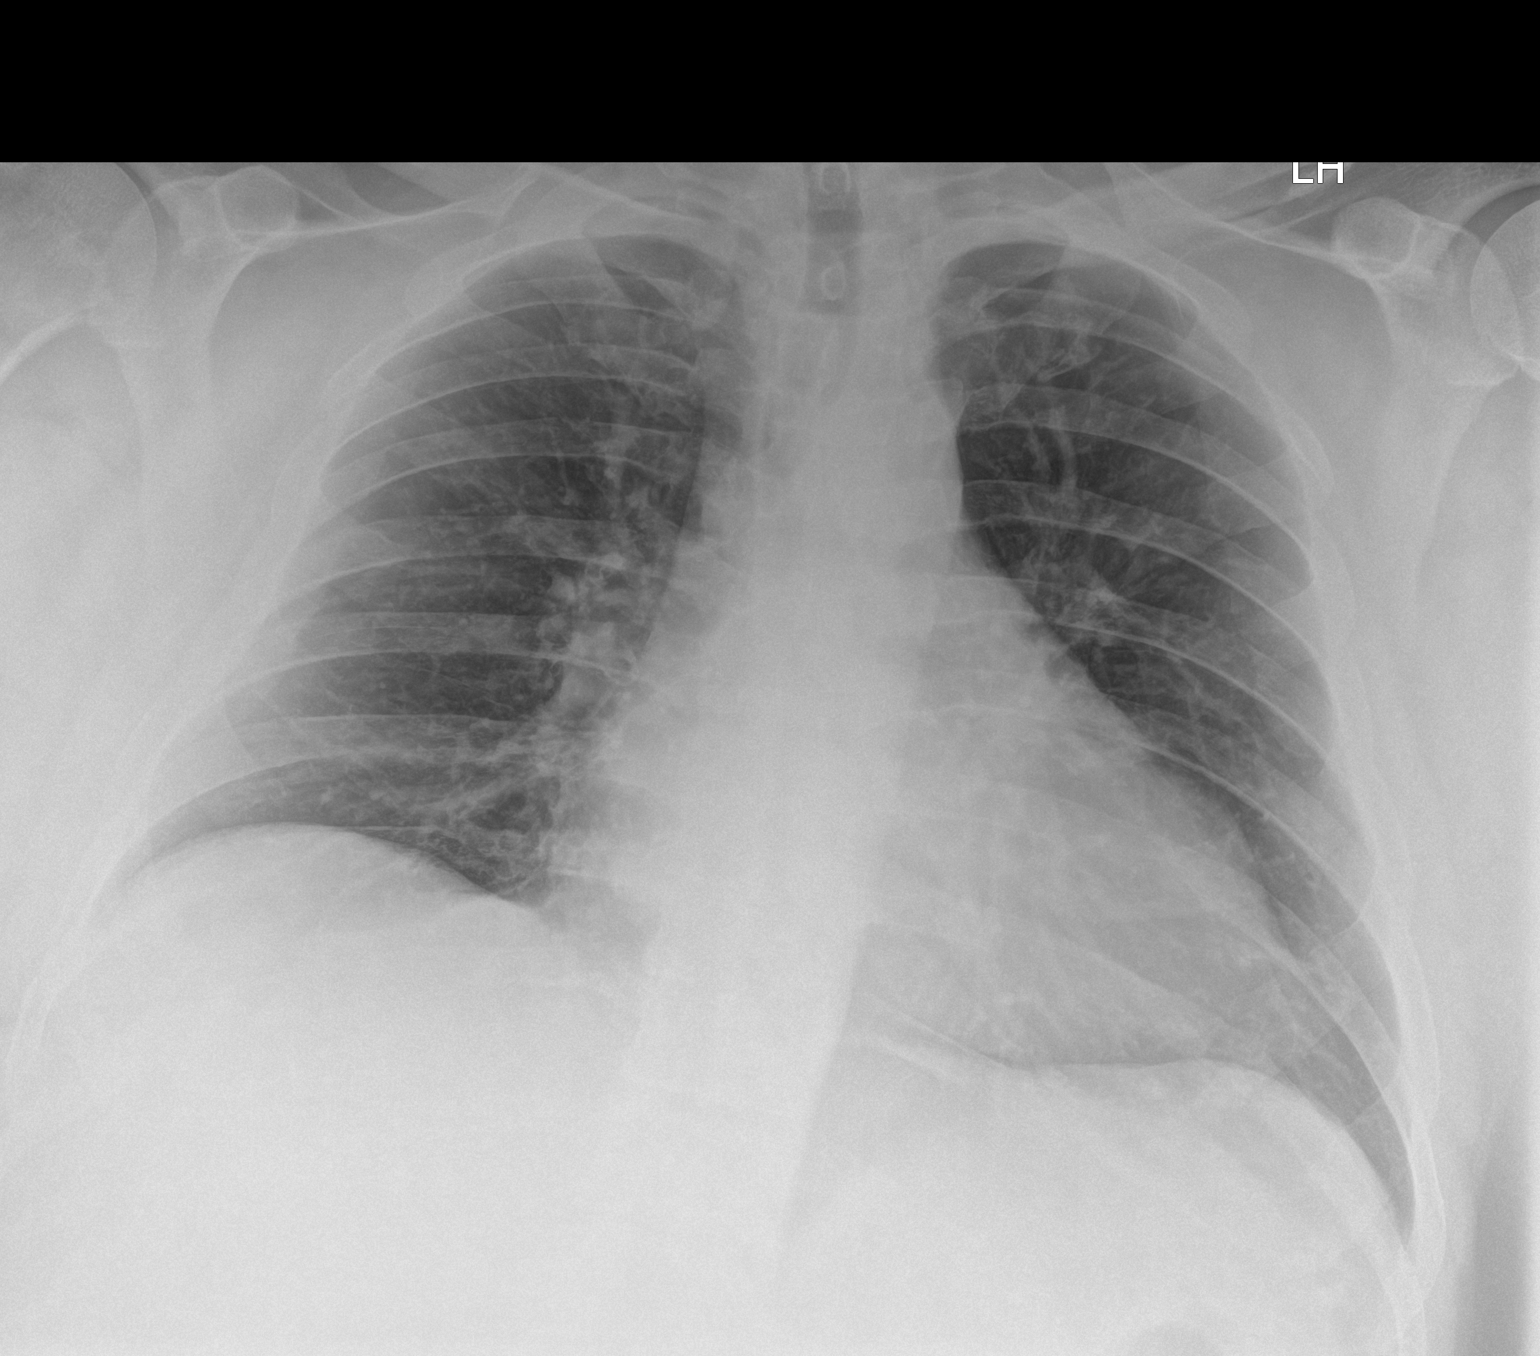

[1 of 1 positions shown; findings below may reference images not displayed]

FINDINGS: The heart size and mediastinal contours are within normal limits.
Both lungs are clear. No pneumothorax or pleural effusion is noted.
The visualized skeletal structures are unremarkable.
IMPRESSION: No active disease.

## 2020-09-28 IMAGING — DX PORTABLE CHEST - 1 VIEW
1 series · 1 of 1 positions shown · non-contrast
Comparison: 05/06/2019

CLINICAL DATA: Cough, congestion, shortness of breath

EXAM:
PORTABLE CHEST 1 VIEW

[chest ap]
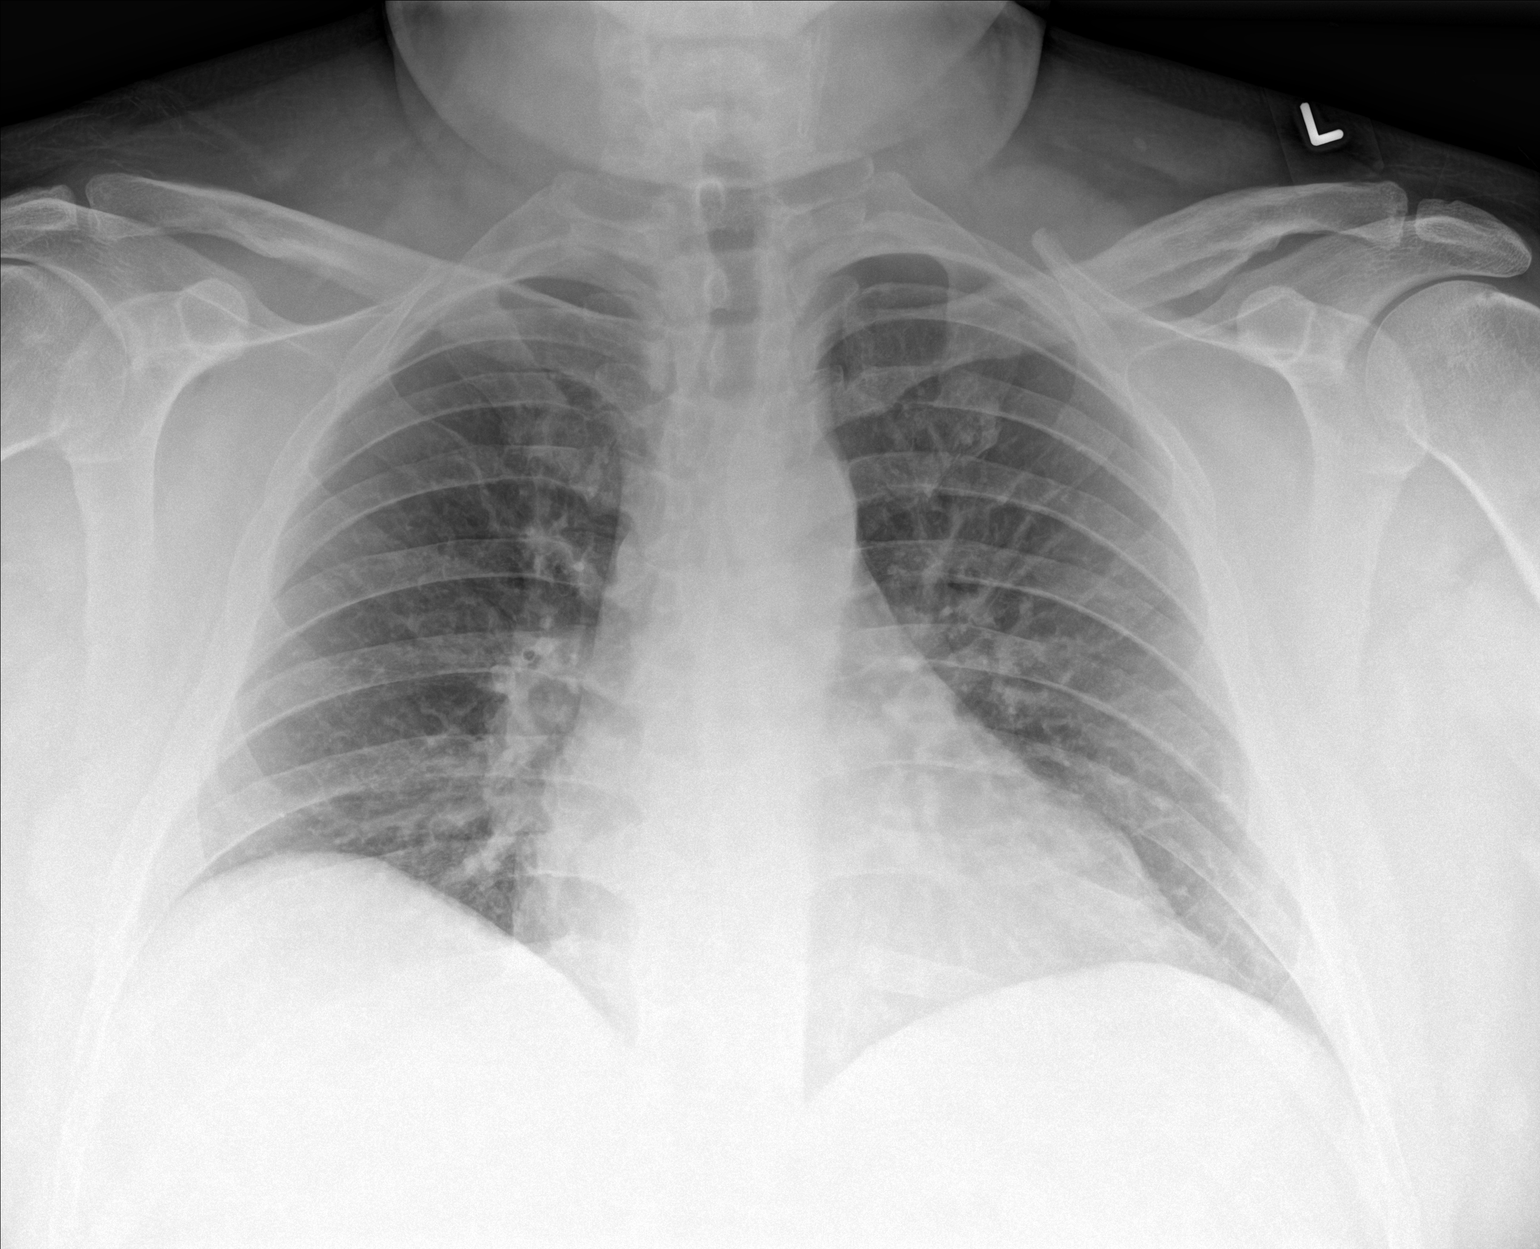

[1 of 1 positions shown; findings below may reference images not displayed]

FINDINGS: Heart and mediastinal contours are within normal limits. No focal
opacities or effusions. No acute bony abnormality.
IMPRESSION: No active disease.

## 2022-08-15 ENCOUNTER — Other Ambulatory Visit: Payer: Self-pay | Admitting: Family Medicine

## 2022-08-15 ENCOUNTER — Ambulatory Visit
Admission: RE | Admit: 2022-08-15 | Discharge: 2022-08-15 | Disposition: A | Discharge status: Home / Self Care | Payer: BC Managed Care – PPO | Source: Ambulatory Visit | Attending: Family Medicine | Admitting: Family Medicine

## 2022-08-15 DIAGNOSIS — R1032 Left lower quadrant pain: Secondary | ICD-10-CM

## 2022-08-15 DIAGNOSIS — K5900 Constipation, unspecified: Secondary | ICD-10-CM

## 2022-08-15 MED ORDER — IOPAMIDOL (ISOVUE-300) INJECTION 61%
100.0000 mL | Freq: Once | INTRAVENOUS | Status: AC | PRN
  Administered 2022-08-15: 100 mL via INTRAVENOUS

## 2022-09-05 ENCOUNTER — Other Ambulatory Visit: Payer: Self-pay | Admitting: Family Medicine

## 2022-09-05 DIAGNOSIS — R7989 Other specified abnormal findings of blood chemistry: Secondary | ICD-10-CM

## 2022-10-01 ENCOUNTER — Other Ambulatory Visit: Payer: BC Managed Care – PPO

## 2022-10-01 ENCOUNTER — Inpatient Hospital Stay: Admission: RE | Admit: 2022-10-01 | Payer: BC Managed Care – PPO | Source: Ambulatory Visit

## 2023-01-15 ENCOUNTER — Encounter: Payer: Self-pay | Admitting: *Deleted

## 2023-01-15 ENCOUNTER — Other Ambulatory Visit: Payer: Self-pay

## 2023-01-15 ENCOUNTER — Emergency Department
Admission: EM | Admit: 2023-01-15 | Discharge: 2023-01-16 | Disposition: A | Discharge status: Home / Self Care | Payer: Self-pay | Attending: Emergency Medicine | Admitting: Emergency Medicine

## 2023-01-15 DIAGNOSIS — D72829 Elevated white blood cell count, unspecified: Secondary | ICD-10-CM | POA: Insufficient documentation

## 2023-01-15 DIAGNOSIS — R1033 Periumbilical pain: Secondary | ICD-10-CM | POA: Insufficient documentation

## 2023-01-15 DIAGNOSIS — E119 Type 2 diabetes mellitus without complications: Secondary | ICD-10-CM | POA: Insufficient documentation

## 2023-01-15 DIAGNOSIS — I10 Essential (primary) hypertension: Secondary | ICD-10-CM | POA: Insufficient documentation

## 2023-01-15 LAB — COMPREHENSIVE METABOLIC PANEL
ALT: 40 U/L (ref 0–44)
AST: 35 U/L (ref 15–41)
Albumin: 4.3 g/dL (ref 3.5–5.0)
Alkaline Phosphatase: 56 U/L (ref 38–126)
Anion gap: 10 (ref 5–15)
BUN: 10 mg/dL (ref 6–20)
CO2: 24 mmol/L (ref 22–32)
Calcium: 9.2 mg/dL (ref 8.9–10.3)
Chloride: 101 mmol/L (ref 98–111)
Creatinine, Ser: 1.08 mg/dL (ref 0.61–1.24)
GFR, Estimated: >60 mL/min (ref >60)
Glucose, Bld: 169 mg/dL — ABNORMAL HIGH (ref 70–99)
Potassium: 3.9 mmol/L (ref 3.5–5.1)
Sodium: 135 mmol/L (ref 135–145)
Total Bilirubin: 0.9 mg/dL (ref 0.3–1.2)
Total Protein: 7.2 g/dL (ref 6.5–8.1)

## 2023-01-15 LAB — URINALYSIS, ROUTINE W REFLEX MICROSCOPIC
Bacteria, UA: NONE SEEN
Bilirubin Urine: NEGATIVE
Glucose, UA: >=500 mg/dL — AB
Hgb urine dipstick: NEGATIVE
Ketones, ur: NEGATIVE mg/dL
Leukocytes,Ua: NEGATIVE
Nitrite: NEGATIVE
Protein, ur: NEGATIVE mg/dL
Specific Gravity, Urine: 1.021 (ref 1.005–1.030)
Squamous Epithelial / HPF: NONE SEEN /HPF (ref 0–5)
pH: 5 (ref 5.0–8.0)

## 2023-01-15 LAB — CBC
HCT: 48.8 % (ref 39.0–52.0)
Hemoglobin: 16.2 g/dL (ref 13.0–17.0)
MCH: 28.9 pg (ref 26.0–34.0)
MCHC: 33.2 g/dL (ref 30.0–36.0)
MCV: 87.1 fL (ref 80.0–100.0)
Platelets: 194 10*3/uL (ref 150–400)
RBC: 5.6 MIL/uL (ref 4.22–5.81)
RDW: 14 % (ref 11.5–15.5)
WBC: 11 10*3/uL — ABNORMAL HIGH (ref 4.0–10.5)
nRBC: 0 % (ref 0.0–0.2)

## 2023-01-15 LAB — LIPASE, BLOOD: Lipase: 44 U/L (ref 11–51)

## 2023-01-15 NOTE — ED Triage Notes (Signed)
Pt has upper abd pain since last night.   pt reports n/v  no back pain.  Pt alert, speech clear.

## 2023-01-15 NOTE — ED Provider Notes (Signed)
[facility] Provider Note    Event Date/Time   First MD Initiated Contact with Patient 01/15/23 2350     (approximate)   History   Abdominal Pain   HPI  Jay Sullivan is a 47 y.o. male who presents to the ED for evaluation of Abdominal Pain   I reviewed PCP visit from 2/29.  Obese patient history of HTN and DM. 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  No other intra-abdominal surgeries.  He presents to the ED for evaluation of intermittent and positional epigastric pain and bulging sensation.  Reports pain is worse when laying down.  Present for about 36 hours with more intense severity, but does admit that the pain has been going on for about 7 or 8 months and his PCP has been working on try to figure out what is going on..  Reports nausea and nonbloody nonbilious emesis.  Reports constipation.  No fevers.  Later on reassessments he tells me more about his testicular pain as well.  No penile pain or discharge.  No trauma or injuries.  No dysuria or hematuria, but he reports when the pain is at its height he feels like his "balls are in a clamp."   Physical Exam   Triage Vital Signs: ED Triage Vitals  Enc Vitals Group     BP 01/15/23 2207 (!) 155/89     Pulse Rate 01/15/23 2207 79     Resp 01/15/23 2207 20     Temp 01/15/23 2207 98 F (36.7 C)     Temp Source 01/15/23 2207 Oral     SpO2 01/15/23 2207 96 %     Weight 01/15/23 2113 264 lb 8.8 oz (120 kg)     Height 01/15/23 2113 5\' 10"  (1.778 m)     Head Circumference --      Peak Flow --      Pain Score 01/15/23 2113 10     Pain Loc --      Pain Edu? --      Excl. in [place]? --     Most recent vital signs: Vitals:   01/15/23 2207  BP: (!) 155/89  Pulse: 79  Resp: 20  Temp: 98 F (36.7 C)  SpO2: 96%    General: Awake, no distress.  CV:  Good peripheral perfusion.  Resp:  Normal  effort.  Abd:  No distention.  Mild palpable diastases superior to the umbilicus but inferior to a well-healed surgical scar.  No apparent hernia.  No skin changes GU examination: No external skin changes or swelling to the penis or scrotum or pubis.  Does have some mild tenderness to the left testicle MSK:  No deformity noted.  Neuro:  No focal deficits appreciated. Other:     ED Results / Procedures / Treatments   Labs (all labs ordered are listed, but only abnormal results are displayed) Labs Reviewed  COMPREHENSIVE METABOLIC PANEL - Abnormal; Notable for the following components:      Result Value   Glucose, Bld 169 (*)    All other components within normal limits  CBC - Abnormal; Notable for the following components:   WBC 11.0 (*)    All other components within normal limits  URINALYSIS, ROUTINE W REFLEX MICROSCOPIC - Abnormal; Notable for the following components:   Color, Urine STRAW (*)    APPearance CLEAR (*)  Glucose, UA >=500 (*)    All other components within normal limits  LIPASE, BLOOD    EKG   RADIOLOGY CT abdomen/pelvis interpreted by me without evidence of acute pathology  Official radiology report(s): US SCROTUM W/DOPPLER  Result Date: 01/16/2023 CLINICAL DATA:  Left testicular pain EXAM: SCROTAL ULTRASOUND DOPPLER ULTRASOUND OF THE TESTICLES TECHNIQUE: Complete ultrasound examination of the testicles, epididymis, and other scrotal structures was performed. Color and spectral Doppler ultrasound were also utilized to evaluate blood flow to the testicles. COMPARISON:  08/16/2014 FINDINGS: Right testicle Measurements: 2.5 x 1.6 x 2.2 cm. Normal parenchymal echogenicity and echotexture. Normal color flow vascularity. No mass or microlithiasis visualized. Left testicle Measurements: 3.6 x 1.9 x 2.2 cm. Normal parenchymal echogenicity and echotexture. Normal color flow vascularity. Since the prior examination, there has developed a minimally complex cystic lesion  demonstrating a few thin noncalcified internal septa within the subcapsular testis measuring 6 x 5 x 4 mm. There is no associated solid component or internal vascularity identified this may represent a complex post inflammatory or posttraumatic cyst. No solid intratesticular mass or microlithiasis visualized. Right epididymis:  Normal in size and appearance. Left epididymis:  Normal in size and appearance. Hydrocele:  Small bilateral hydroceles are present Varicocele:  Small bilateral varicoceles are present Pulsed Doppler interrogation of both testes demonstrates normal low resistance arterial and venous waveforms bilaterally. IMPRESSION: 1. No evidence of testicular torsion. 2. Interval development of a 6 mm minimally complex cystic lesion within the subcapsular left testis, possibly representing a complex post inflammatory or posttraumatic cyst. 3. Small bilateral hydroceles. 4. Small bilateral varicoceles. Electronically Signed   By: Fidela Salisbury M.D.   On: 01/16/2023 03:18   CT ABDOMEN PELVIS W CONTRAST  Result Date: 01/16/2023 CLINICAL DATA:  Intermittent anterior incisional hernia. Evaluate for small bowel obstruction. EXAM: CT ABDOMEN AND PELVIS WITH CONTRAST TECHNIQUE: Multidetector CT imaging of the abdomen and pelvis was performed using the standard protocol following bolus administration of intravenous contrast. RADIATION DOSE REDUCTION: This exam was performed according to the departmental dose-optimization program which includes automated exposure control, adjustment of the mA and/or kV according to patient size and/or use of iterative reconstruction technique. CONTRAST:  165mL OMNIPAQUE IOHEXOL 350 MG/ML SOLN COMPARISON:  CT abdomen pelvis dated 08/15/2022. FINDINGS: Lower chest: The visualized lung bases are clear. No intra-abdominal free air or free fluid. Hepatobiliary: The liver is unremarkable. No biliary dilatation the gallbladder is unremarkable. Pancreas: Subcentimeter hypodense lesion  in the tail of the pancreas (54/5) is not characterized but may represent a side branch IPMN. No dilatation of the main pancreatic duct or gland atrophy. No active inflammatory changes. Spleen: Normal in size without focal abnormality. Adrenals/Urinary Tract: The adrenal glands are unremarkable. The kidneys, visualized ureters, and urinary bladder appear unremarkable. Stomach/Bowel: There is loose stool within the colon. There is no bowel obstruction or active inflammation. The appendix is normal. Vascular/Lymphatic: The abdominal aorta and IVC are unremarkable. No portal venous gas. There is no adenopathy. Reproductive: The prostate and seminal vesicles are grossly unremarkable. No pelvic mass. Other: Mild rectus sheath diastasis.  No hernia. Musculoskeletal: Degenerative changes of the spine. No acute osseous pathology. IMPRESSION: 1. Mild diastasis of the anterior abdominal wall musculature. No hernia. 2. No bowel obstruction. Normal appendix. 3. Subcentimeter hypodense lesion in the tail of the pancreas, not characterized but may represent a side branch IPMN. Electronically Signed   By: Anner Crete M.D.   On: 01/16/2023 00:47    PROCEDURES and INTERVENTIONS:  Procedures  Medications  ketorolac (TORADOL) 30 MG/ML injection 15 mg (15 mg Intravenous Given 01/16/23 0014)  iohexol (OMNIPAQUE) 350 MG/ML injection 100 mL (100 mLs Intravenous Contrast Given 01/16/23 0018)  alum & mag hydroxide-simeth (MAALOX/MYLANTA) 200-200-20 MG/5ML suspension 30 mL (30 mLs Oral Given 01/16/23 0104)  lidocaine (XYLOCAINE) 2 % viscous mouth solution 15 mL (15 mLs Mouth/Throat Given 01/16/23 0104)  dicyclomine (BENTYL) capsule 10 mg (10 mg Oral Given 01/16/23 0106)     IMPRESSION / MDM / ASSESSMENT AND PLAN / ED COURSE  I reviewed the triage vital signs and the nursing notes.  Differential diagnosis includes, but is not limited to, gastroenteritis, IBS, cholelithiasis, gastritis or GERD  {Patient presents with  symptoms of an acute illness or injury that is potentially life-threatening.  47 year old presents with acute on chronic periumbilical and epigastric pain of uncertain etiology and suitable for outpatient management with GI follow-up.  Looks systemically well and has some mild localized tenderness without peritoneal features.  Blood work with a marginal leukocytosis.  Normal metabolic panel and lipase.  Urine without infectious features.  CT without signs of any hernias, incarceration or other derangements.  Scrotal ultrasound without signs of torsion and shows a possible cyst in the left testicle.  His symptoms are controlled and he is suitable for outpatient management.  He has a PCP he can follow-up with and I will refer him to GI.  We discussed return precautions.  Clinical Course as of 01/16/23 0355  Thu Jan 16, 2023  0054 Reassessed. No change with toradol. We discussed CT [DS]  0151 Reassessed.  He provide some supplemental history.  He is now telling me about his scrotal and testicular discomfort.  I examined this and he has some left testicular tenderness on exam.  We will get a scrotal ultrasound [DS]  0350 Reassessed.  Feeling little better.  We discussed etiologies possibly contributing to his symptoms and plan of care.  GI follow-up. [DS]    Clinical Course User Index [DS] Vladimir Crofts, MD     FINAL CLINICAL IMPRESSION(S) / ED DIAGNOSES   Final diagnoses:  Periumbilical abdominal pain     Rx / DC Orders   ED Discharge Orders          Ordered    oxyCODONE (ROXICODONE) 5 MG immediate release tablet  Every 8 hours PRN        01/16/23 0352    ondansetron (ZOFRAN-ODT) 4 MG disintegrating tablet  Every 8 hours PRN        01/16/23 0352    dicyclomine (BENTYL) 10 MG capsule  3 times daily before meals & bedtime        01/16/23 0352             Note:  This document was prepared using Dragon voice recognition software and may include unintentional dictation errors.    Vladimir Crofts, MD 01/16/23 [phone]

## 2023-01-16 ENCOUNTER — Emergency Department: Payer: Self-pay

## 2023-01-16 MED ORDER — IOHEXOL 350 MG/ML SOLN
100.0000 mL | Freq: Once | INTRAVENOUS | Status: AC | PRN
  Administered 2023-01-16: 100 mL via INTRAVENOUS

## 2023-01-16 MED ORDER — KETOROLAC TROMETHAMINE 30 MG/ML IJ SOLN
15.0000 mg | Freq: Once | INTRAMUSCULAR | Status: AC
  Administered 2023-01-16: 15 mg via INTRAVENOUS
  Filled 2023-01-16: qty 1

## 2023-01-16 MED ORDER — ONDANSETRON 4 MG PO TBDP: 4.0000 mg | ORAL_TABLET | Freq: Three times a day (TID) | ORAL | 0 refills | Status: AC | PRN

## 2023-01-16 MED ORDER — ALUM & MAG HYDROXIDE-SIMETH 200-200-20 MG/5ML PO SUSP
30.0000 mL | Freq: Once | ORAL | Status: AC
  Administered 2023-01-16: 30 mL via ORAL
  Filled 2023-01-16: qty 30

## 2023-01-16 MED ORDER — DICYCLOMINE HCL 10 MG PO CAPS
10.0000 mg | ORAL_CAPSULE | Freq: Once | ORAL | Status: AC
  Administered 2023-01-16: 10 mg via ORAL
  Filled 2023-01-16: qty 1

## 2023-01-16 MED ORDER — DICYCLOMINE HCL 10 MG PO CAPS: 10.0000 mg | ORAL_CAPSULE | Freq: Three times a day (TID) | ORAL | 1 refills | Status: AC

## 2023-01-16 MED ORDER — LIDOCAINE VISCOUS HCL 2 % MT SOLN
15.0000 mL | Freq: Once | OROMUCOSAL | Status: AC
  Administered 2023-01-16: 15 mL via OROMUCOSAL
  Filled 2023-01-16: qty 15

## 2023-01-16 MED ORDER — OXYCODONE HCL 5 MG PO TABS: 5.0000 mg | ORAL_TABLET | Freq: Three times a day (TID) | ORAL | 0 refills | Status: AC | PRN

## 2023-01-16 NOTE — ED Notes (Signed)
Pt taken to Ultrasound.

## 2023-01-16 NOTE — Discharge Instructions (Addendum)
Please take Tylenol and ibuprofen/Advil for your pain.  It is safe to take them together, or to alternate them every few hours.  Take up to 1000mg  of Tylenol at a time, up to 4 times per day.  Do not take more than 4000 mg of Tylenol in 24 hours.  For ibuprofen, take 400-600 mg, 3 - 4 times per day.  Use Zofran as needed for nausea and vomiting  Use oxycodone as needed for more severe/breakthrough pain  You can try the dicyclomine/Bentyl medications up to 4 times per day.  This medication helps with spasms of the intestines and irritable bowel syndrome.  Try the GI doctor I have listed and hopefully they can see you sooner.  Return to the ED with any fevers or worsening symptoms despite this

## 2024-10-14 ENCOUNTER — Encounter: Payer: Self-pay | Admitting: Emergency Medicine

## 2024-10-14 ENCOUNTER — Emergency Department: Payer: Worker's Compensation

## 2024-10-14 ENCOUNTER — Emergency Department

## 2024-10-14 ENCOUNTER — Other Ambulatory Visit: Payer: Self-pay

## 2024-10-14 ENCOUNTER — Emergency Department
Admission: EM | Admit: 2024-10-14 | Discharge: 2024-10-14 | Disposition: A | Discharge status: Home / Self Care | Payer: Worker's Compensation | Attending: Emergency Medicine | Admitting: Emergency Medicine

## 2024-10-14 DIAGNOSIS — Z23 Encounter for immunization: Secondary | ICD-10-CM | POA: Insufficient documentation

## 2024-10-14 DIAGNOSIS — I10 Essential (primary) hypertension: Secondary | ICD-10-CM | POA: Diagnosis not present

## 2024-10-14 DIAGNOSIS — T07XXXA Unspecified multiple injuries, initial encounter: Secondary | ICD-10-CM | POA: Insufficient documentation

## 2024-10-14 DIAGNOSIS — W01198A Fall on same level from slipping, tripping and stumbling with subsequent striking against other object, initial encounter: Secondary | ICD-10-CM | POA: Diagnosis not present

## 2024-10-14 DIAGNOSIS — M25512 Pain in left shoulder: Secondary | ICD-10-CM | POA: Insufficient documentation

## 2024-10-14 DIAGNOSIS — E119 Type 2 diabetes mellitus without complications: Secondary | ICD-10-CM | POA: Insufficient documentation

## 2024-10-14 DIAGNOSIS — W19XXXA Unspecified fall, initial encounter: Secondary | ICD-10-CM

## 2024-10-14 DIAGNOSIS — Y990 Civilian activity done for income or pay: Secondary | ICD-10-CM | POA: Insufficient documentation

## 2024-10-14 MED ORDER — OXYCODONE-ACETAMINOPHEN 5-325 MG PO TABS
1.0000 | ORAL_TABLET | ORAL | Status: DC | PRN
  Administered 2024-10-14: 06:00:00 1 via ORAL
  Filled 2024-10-14: qty 1

## 2024-10-14 MED ORDER — ONDANSETRON 4 MG PO TBDP
4.0000 mg | ORAL_TABLET | Freq: Once | ORAL | Status: AC
  Administered 2024-10-14: 06:00:00 4 mg via ORAL
  Filled 2024-10-14: qty 1

## 2024-10-14 MED ORDER — TETANUS-DIPHTH-ACELL PERTUSSIS 5-2-15.5 LF-MCG/0.5 IM SUSP
0.5000 mL | Freq: Once | INTRAMUSCULAR | Status: AC
  Administered 2024-10-14: 08:00:00 0.5 mL via INTRAMUSCULAR

## 2024-10-14 MED ORDER — KETOROLAC TROMETHAMINE 15 MG/ML IJ SOLN
15.0000 mg | Freq: Once | INTRAMUSCULAR | Status: AC
  Administered 2024-10-14: 15:00:00 15 mg via INTRAVENOUS
  Filled 2024-10-14: qty 1

## 2024-10-14 MED ORDER — OXYCODONE HCL 5 MG PO TABS: 5.0000 mg | ORAL_TABLET | Freq: Four times a day (QID) | ORAL | 0 refills | Status: AC | PRN

## 2024-10-14 MED ORDER — IBUPROFEN 600 MG PO TABS
600.0000 mg | ORAL_TABLET | Freq: Once | ORAL | Status: AC
  Administered 2024-10-14: 08:00:00 600 mg via ORAL
  Filled 2024-10-14: qty 1

## 2024-10-14 MED ORDER — MORPHINE SULFATE (PF) 4 MG/ML IV SOLN
4.0000 mg | Freq: Once | INTRAVENOUS | Status: AC
  Administered 2024-10-14: 12:00:00 4 mg via INTRAVENOUS
  Filled 2024-10-14: qty 1

## 2024-10-14 MED ORDER — MORPHINE SULFATE (PF) 4 MG/ML IV SOLN
4.0000 mg | Freq: Once | INTRAVENOUS | Status: AC
  Administered 2024-10-14: 10:00:00 4 mg via INTRAVENOUS
  Filled 2024-10-14: qty 1

## 2024-10-14 NOTE — ED Provider Notes (Signed)
 [facility] Provider Note    Event Date/Time   First MD Initiated Contact with Patient 10/14/24 0710     (approximate)   History   Fall and Shoulder Pain   HPI  Jay Sullivan is a 48 y.o. male   presents to the ED after a fall that occurred this morning while at work.  Patient states that he was tying down some straps when the strap broke causing him to fall into a large tire.  Patient reports that he hit his head and unsure if there was LOC.  He denies any visual changes, nausea, vomiting but does endorse a headache.  Patient also complains of neck pain and left shoulder pain.  Patient has a history of a rotator cuff injury to his right and has been seen by Dr. Edie in the past.  He also has a history of diabetes type 2, hypertension, lumbar spine degenerative disc disease, osteoarthritis of the right knee, chronic pain, PTSD, PUD, depression.      Physical Exam   Triage Vital Signs: ED Triage Vitals  Encounter Vitals Group     BP 10/14/24 0605 137/85     Girls Systolic BP Percentile --      Girls Diastolic BP Percentile --      Boys Systolic BP Percentile --      Boys Diastolic BP Percentile --      Pulse Rate 10/14/24 0605 (!) 120     Resp 10/14/24 0605 18     Temp 10/14/24 0605 97.7 F (36.5 C)     Temp Source 10/14/24 0605 Oral     SpO2 10/14/24 0605 97 %     Weight --      Height --      Head Circumference --      Peak Flow --      Pain Score 10/14/24 0606 7     Pain Loc --      Pain Education --      Exclude from Growth Chart --     Most recent vital signs: Vitals:   10/14/24 0720 10/14/24 1047  BP:  (!) 140/75  Pulse:  92  Resp:  17  Temp:  97.8 F (36.6 C)  SpO2: 97% 97%     General: Awake, no distress.  Alert, talkative, cooperative.  Patient is able to answer questions without any difficulty.  Patient with a superficial abrasion to his left eyebrow without active bleeding. CV:  Good peripheral perfusion.  Heart  regular rate rhythm. Resp:  Normal effort.  Lungs are clear bilaterally.  No tenderness noted on palpation of the ribs bilaterally. Abd:  No distention.  Soft, nontender. Other:  PERRLA, EOMI's, cranial nerves II through XII grossly intact.  Patient has some tenderness on palpation of the cervical spine posteriorly.  No trauma to the mouth area.  Patient with marked tenderness to the left shoulder with range of motion limited secondary to severe increased pain.  Radial pulse present.  Patient is able to move digits left hand.  No abrasions or ecchymosis is noted to the left shoulder.  Marked tenderness on palpation of the AC joint and deltoid area.  Skin is intact.  No point tenderness noted on palpation of the thoracic or lumbar spine.  Patient is able to stand and ambulate without any assistance.   ED Results / Procedures / Treatments   Labs (all labs ordered are listed, but only abnormal results are displayed) Labs Reviewed -  No data to display   RADIOLOGY CT head and cervical spine per radiology is negative for acute intracranial injury or cervical spine fracture/subluxation.  Cervical spine also shows multilevel degenerative disc disease from C4-C7.  Left shoulder x-ray images were reviewed and interpreted by myself and the radiologist was negative for fracture or dislocation.  MR left shoulder without contrast is pending.  PROCEDURES:  Critical Care performed:   Procedures   MEDICATIONS ORDERED IN ED: Medications  oxyCODONE -acetaminophen  (PERCOCET/ROXICET) 5-325 MG per tablet 1 tablet (1 tablet Oral Given 10/14/24 0613)  ondansetron  (ZOFRAN -ODT) disintegrating tablet 4 mg (4 mg Oral Given 10/14/24 9386)  Tdap (ADACEL) injection 0.5 mL (0.5 mLs Intramuscular Given 10/14/24 0818)  ibuprofen  (ADVIL ) tablet 600 mg (600 mg Oral Given 10/14/24 0817)  morphine  (PF) 4 MG/ML injection 4 mg (4 mg Intravenous Given 10/14/24 0931)  morphine  (PF) 4 MG/ML injection 4 mg (4 mg Intravenous  Given 10/14/24 1213)  ketorolac  (TORADOL ) 15 MG/ML injection 15 mg (15 mg Intravenous Given 10/14/24 1442)     IMPRESSION / MDM / ASSESSMENT AND PLAN / ED COURSE  I reviewed the triage vital signs and the nursing notes.   Differential diagnosis includes, but is not limited to, head injury, cervical fracture, strain, contusions, multiple abrasions, left shoulder fracture, dislocation, contusion, rotator cuff injury, tendon rupture.  48 year old male presents to the ED after a fall that occurred at work this morning when he was strapping down some items when the strap broke causing him to fall incident to a tire.  Patient denies any loss of consciousness.  CT scan of head and cervical spine were negative for acute changes.  Patient continued to complain of severe pain to his left shoulder.  X-rays were negative however with the amount of pain that he was in a MRI was obtained to further evaluate his shoulder injury.  At the time of his discharge the MRI had not resulted but patient was made aware that he was still have to follow-up with orthopedics.  He was instructed to watch the abrasions for any signs of infection and clean daily with mild soap and water.  A sling was applied to his left arm for support and protection and he is to follow-up with [facility] orthopedic department.  A note was written to his work stating that he had no use of his left arm until further evaluated by orthopedist.  A prescription for oxycodone  was sent to the pharmacy for him to take as needed for pain.  He is to return to the emergency department if any severe worsening of his symptoms or urgent concerns.      Patient's presentation is most consistent with acute illness / injury with system symptoms.  FINAL CLINICAL IMPRESSION(S) / ED DIAGNOSES   Final diagnoses:  Multiple contusions  Acute pain of left shoulder  Abrasions of multiple sites  Fall, initial encounter     Rx / DC Orders   ED Discharge  Orders          Ordered    oxyCODONE  (OXY IR/ROXICODONE ) 5 MG immediate release tablet  Every 6 hours PRN        10/14/24 1420             Note:  This document was prepared using Dragon voice recognition software and may include unintentional dictation errors.   Saunders Shona CROME, PA-C 10/14/24 1452    Jacolyn Pae, MD 10/14/24 [phone]

## 2024-10-14 NOTE — ED Notes (Addendum)
 Pt employed with [employer] in [place] and would like to have visit filed as workers comp; pt has no paperwork with him at this time; pt informed to f/u with supervisor regarding workers comp requirements

## 2024-10-14 NOTE — ED Triage Notes (Signed)
 Pt arrives POV from work, ambulatory to triage, gait steady, no acute distress noted c/o head pain, neck pain, and left shoulder pain after falling into tire after trying to strap down something on trailer. He states there was a lot of tension behind straps and the straps broke causing him to fall into tire where he hit his head, neck, and left shoulder, unsure of LOC, denies blood thinners.

## 2024-10-14 NOTE — Discharge Instructions (Signed)
 Call make appointment with Dr. Edie or [facility] orthopedics for follow-up of your left shoulder injury.  You can look on MyChart to see the results of your MRI.  Ice and elevation.  Wear the shoulder sling for protection and support.  A prescription for pain medication was sent to the pharmacy for you to take every 6 hours as needed for pain.  Do not drive or operate machinery while taking this medication as it could cause drowsiness and increased risk for injury.  Watch the abrasions for any signs of infection and continue to clean daily with mild soap and water and allowed to dry completely.  If any severe worsening of your symptoms return to the emergency department.
